# Patient Record
Sex: Female | Born: 1955 | Race: White | Hispanic: No | Marital: Married | Smoking: Current every day smoker
Health system: Southern US, Community
[De-identification: ages and names within clinical notes are randomized; demographics above are authoritative.]

## PROBLEM LIST (undated history)

## (undated) DIAGNOSIS — I10 Essential (primary) hypertension: Secondary | ICD-10-CM

## (undated) DIAGNOSIS — T7840XA Allergy, unspecified, initial encounter: Secondary | ICD-10-CM

## (undated) DIAGNOSIS — B019 Varicella without complication: Secondary | ICD-10-CM

## (undated) DIAGNOSIS — E7800 Pure hypercholesterolemia, unspecified: Secondary | ICD-10-CM

## (undated) DIAGNOSIS — F17200 Nicotine dependence, unspecified, uncomplicated: Secondary | ICD-10-CM

## (undated) HISTORY — DX: Varicella without complication: B01.9

## (undated) HISTORY — DX: Pure hypercholesterolemia, unspecified: E78.00

## (undated) HISTORY — DX: Nicotine dependence, unspecified, uncomplicated: F17.200

## (undated) HISTORY — DX: Essential (primary) hypertension: I10

## (undated) HISTORY — DX: Allergy, unspecified, initial encounter: T78.40XA

---
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
## 2002-09-28 HISTORY — PX: TOTAL ABDOMINAL HYSTERECTOMY W/ BILATERAL SALPINGOOPHORECTOMY: SHX83

## 2010-09-28 HISTORY — PX: COLONOSCOPY: SHX174

## 2013-01-11 ENCOUNTER — Other Ambulatory Visit: Payer: Self-pay | Admitting: Family Medicine

## 2013-01-11 DIAGNOSIS — Z1231 Encounter for screening mammogram for malignant neoplasm of breast: Secondary | ICD-10-CM

## 2013-02-10 ENCOUNTER — Ambulatory Visit: Payer: Self-pay

## 2013-02-16 ENCOUNTER — Ambulatory Visit: Payer: Self-pay

## 2013-02-16 ENCOUNTER — Ambulatory Visit
Admission: RE | Admit: 2013-02-16 | Discharge: 2013-02-16 | Disposition: A | Discharge status: Home / Self Care | Payer: BC Managed Care – PPO | Source: Ambulatory Visit | Attending: Family Medicine | Admitting: Family Medicine

## 2013-02-16 DIAGNOSIS — Z1231 Encounter for screening mammogram for malignant neoplasm of breast: Secondary | ICD-10-CM

## 2013-02-16 LAB — HM MAMMOGRAPHY

## 2014-03-28 ENCOUNTER — Other Ambulatory Visit: Payer: Self-pay

## 2014-03-28 DIAGNOSIS — Z1231 Encounter for screening mammogram for malignant neoplasm of breast: Secondary | ICD-10-CM

## 2014-04-09 ENCOUNTER — Ambulatory Visit
Admission: RE | Admit: 2014-04-09 | Discharge: 2014-04-09 | Disposition: A | Discharge status: Home / Self Care | Payer: BC Managed Care – PPO | Source: Ambulatory Visit

## 2014-04-09 DIAGNOSIS — Z1231 Encounter for screening mammogram for malignant neoplasm of breast: Secondary | ICD-10-CM

## 2016-04-21 DIAGNOSIS — F17210 Nicotine dependence, cigarettes, uncomplicated: Secondary | ICD-10-CM | POA: Diagnosis not present

## 2016-04-21 DIAGNOSIS — I10 Essential (primary) hypertension: Secondary | ICD-10-CM | POA: Diagnosis not present

## 2016-04-21 DIAGNOSIS — E785 Hyperlipidemia, unspecified: Secondary | ICD-10-CM | POA: Diagnosis not present

## 2016-04-21 DIAGNOSIS — Z0000 Encounter for general adult medical examination without abnormal findings: Secondary | ICD-10-CM | POA: Diagnosis not present

## 2016-04-21 LAB — LIPID PANEL
Cholesterol: 163 (ref 0–200)
HDL: 53 (ref 35–70)
LDL CALC: 91
Triglycerides: 94 (ref 40–160)

## 2016-04-21 LAB — BASIC METABOLIC PANEL
BUN: 7 (ref 4–21)
Creatinine: 0.7 (ref 0.5–1.1)
GLUCOSE: 100
Potassium: 4.1 (ref 3.4–5.3)
SODIUM: 140 (ref 137–147)

## 2016-04-21 LAB — HEPATIC FUNCTION PANEL
ALT: 16 (ref 7–35)
AST: 16 (ref 13–35)
Alkaline Phosphatase: 74 (ref 25–125)
Bilirubin, Direct: 0.7 — AB (ref 0.01–0.4)

## 2016-04-21 LAB — CBC AND DIFFERENTIAL
HCT: 45 (ref 36–46)
HEMOGLOBIN: 15.1 (ref 12.0–16.0)
NEUTROS ABS: 79
PLATELETS: 239 (ref 150–399)
WBC: 14.3

## 2016-05-21 ENCOUNTER — Telehealth: Payer: Self-pay | Admitting: Acute Care

## 2016-05-21 NOTE — Telephone Encounter (Signed)
Referral was canceled on 03/12/16 for the lung cancer screening program due to lack of communication with the pt. I sent notification to Dr. Elvis CoilJordan's office through fax. Form was sent to medical records to be scanned. Received form on 05/15/16 stating that form could not be scanned in chart. Nothing further needed.

## 2017-02-02 DIAGNOSIS — Z01419 Encounter for gynecological examination (general) (routine) without abnormal findings: Secondary | ICD-10-CM | POA: Diagnosis not present

## 2017-03-01 DIAGNOSIS — J029 Acute pharyngitis, unspecified: Secondary | ICD-10-CM | POA: Diagnosis not present

## 2017-03-09 DIAGNOSIS — H6502 Acute serous otitis media, left ear: Secondary | ICD-10-CM | POA: Diagnosis not present

## 2017-03-15 ENCOUNTER — Other Ambulatory Visit: Payer: Self-pay | Admitting: Family Medicine

## 2017-03-15 ENCOUNTER — Other Ambulatory Visit: Payer: Self-pay | Admitting: Nurse Practitioner

## 2017-03-15 DIAGNOSIS — Z1231 Encounter for screening mammogram for malignant neoplasm of breast: Secondary | ICD-10-CM

## 2017-03-23 ENCOUNTER — Encounter: Payer: Self-pay | Admitting: Radiology

## 2017-03-23 ENCOUNTER — Ambulatory Visit
Admission: RE | Admit: 2017-03-23 | Discharge: 2017-03-23 | Disposition: A | Discharge status: Home / Self Care | Payer: BLUE CROSS/BLUE SHIELD | Source: Ambulatory Visit | Attending: Family Medicine | Admitting: Family Medicine

## 2017-03-23 DIAGNOSIS — Z1231 Encounter for screening mammogram for malignant neoplasm of breast: Secondary | ICD-10-CM | POA: Diagnosis not present

## 2017-03-30 ENCOUNTER — Encounter: Payer: Self-pay | Admitting: Family Medicine

## 2017-03-30 ENCOUNTER — Ambulatory Visit (INDEPENDENT_AMBULATORY_CARE_PROVIDER_SITE_OTHER): Payer: BLUE CROSS/BLUE SHIELD | Admitting: Family Medicine

## 2017-03-30 VITALS — BP 122/74 | HR 74 | Temp 98.0°F | Resp 20 | Ht 66.0 in | Wt 157.0 lb

## 2017-03-30 DIAGNOSIS — Z716 Tobacco abuse counseling: Secondary | ICD-10-CM | POA: Diagnosis not present

## 2017-03-30 DIAGNOSIS — I10 Essential (primary) hypertension: Secondary | ICD-10-CM | POA: Diagnosis not present

## 2017-03-30 DIAGNOSIS — Z114 Encounter for screening for human immunodeficiency virus [HIV]: Secondary | ICD-10-CM

## 2017-03-30 DIAGNOSIS — Z720 Tobacco use: Secondary | ICD-10-CM

## 2017-03-30 DIAGNOSIS — F17200 Nicotine dependence, unspecified, uncomplicated: Secondary | ICD-10-CM | POA: Insufficient documentation

## 2017-03-30 DIAGNOSIS — E7800 Pure hypercholesterolemia, unspecified: Secondary | ICD-10-CM

## 2017-03-30 DIAGNOSIS — Z1159 Encounter for screening for other viral diseases: Secondary | ICD-10-CM

## 2017-03-30 DIAGNOSIS — Z7689 Persons encountering health services in other specified circumstances: Secondary | ICD-10-CM

## 2017-03-30 MED ORDER — ATORVASTATIN CALCIUM 10 MG PO TABS: 10.0000 mg | ORAL_TABLET | Freq: Every day | ORAL | 1 refills | Status: DC

## 2017-03-30 MED ORDER — LISINOPRIL-HYDROCHLOROTHIAZIDE 20-12.5 MG PO TABS: 1.0000 | ORAL_TABLET | Freq: Every day | ORAL | 1 refills | Status: DC

## 2017-03-30 NOTE — Progress Notes (Signed)
Patient ID: Julia CoinAlice Helmstetter, female  DOB: Oct 29, 1955, 61 y.o.   MRN: [id number] Patient Care Team    Relationship Specialty Notifications Start End  Natalia LeatherwoodKuneff, Renee A, DO PCP - General Family Medicine  03/30/17   Aline Augusthongteum, Auma N, NP Nurse Practitioner Gynecology  03/30/17     Chief Complaint  Patient presents with  . Establish Care    Subjective:  Julia Erickson is a 61 y.o.  female present for new patient establishment. All past medical history, surgical history, allergies, family history, immunizations, medications and social history were obtained and entered in the electronic medical record today. All recent labs, ED visits and hospitalizations within the last year were reviewed.  Hypertension/hyperlipidemia: Pt reports compliance with Lisinopril/HCTZ 20-12.5 mg daily and atorvastatin 10 mg daily. Blood pressures ranges at home within normal limits. Patient denies chest pain, shortness of breath or lower extremity edema. Pt does not take a daily baby ASA. Pt is  prescribed statin. BMP: 04/21/2016 creatinine 0.67 CBC: Unknown, records requested Diet: Low-sodium Exercise: Exercise RF: Hypertension, elevated LDL, family history of heart disease in her parents  Everyday smoker: basic ultra lights, has smoked intermittently over the last 30 years. She has been prescribed Chantix twice within the last 2 years, and has never started it. She wants to quit, but she is not quite ready to start.  Health maintenance:  [place]: [place], [place]. Patient reports normal, 10 year follow-up. Mammogram: completed: 02/2017; birads 1  completed at gynecology Cervical cancer screening: last pap: 2018, results: Normal per patient, completed by: Gynecologist Immunizations: tdap 2016, Influenza  (encouraged yearly), should be offered  Pneumovax 23 with smoking history on CPE, Shingrix will be offered on CPE Infectious disease screening: HIV and Hep C will be collected on CPE, patient is agreeable to  testing today. DEXA: Patient reports DEXA scan approximately 5 years ago, and was normal. She has been estrogen deficient for some time, with a hysterectomy/oophorectomy.   Depression screen PHQ 2/9 03/30/2017  Decreased Interest 0  Down, Depressed, Hopeless 0  PHQ - 2 Score 0   No flowsheet data found.  Current Exercise Habits: The patient does not participate in regular exercise at present Exercise limited by: None identified Fall Risk  03/30/2017  Falls in the past year? No    There is no immunization history on file for this patient.  No exam data present  Past Medical History:  Diagnosis Date  . Allergy   . Chickenpox   . Elevated LDL cholesterol level   . Hypertension   . Smoker    Started about age 61, has  smoked int. over the years.    Allergies  Allergen Reactions  . Sulfa Antibiotics Hives  . Other Nausea And Vomiting    Pine nuts   Past Surgical History:  Procedure Laterality Date  . TONSILLECTOMY AND ADENOIDECTOMY  1969  . TOTAL ABDOMINAL HYSTERECTOMY W/ BILATERAL SALPINGOOPHORECTOMY  2004   Fibroid; heavy bleeding.    Family History  Problem Relation Age of Onset  . Diabetes Mother   . Heart disease Mother   . Diabetes Father   . Heart disease Father   . Early death Father   . Diabetes Brother   . Stroke Maternal Grandmother    Social History   Social History  . Marital status: Married    Spouse name: Rocky LinkKen  . Number of children: 4  . Years of education: 6616   Occupational History  . retired  Social History Main Topics  . Smoking status: Current Every Day Smoker    Packs/day: 1.00    Years: 30.00  . Smokeless tobacco: Never Used  . Alcohol use 0.6 oz/week    1 Glasses of wine per week  . Drug use: No  . Sexual activity: Yes    Partners: Male   Other Topics Concern  . Not on file   Social History Narrative   Married to [place]. They have 4 children.   BS. Retired Armed forces logistics/support/administrative officer.   Drinks caffeine.   Wears her seatbelt. Wears a  bicycle helmet. Smoke detector in the home.   Feels safe in her relationships.      Allergies as of 03/30/2017      Reactions   Sulfa Antibiotics Hives   Other Nausea And Vomiting   Pine nuts      Medication List       Accurate as of 03/30/17 11:07 AM. Always use your most recent med list.          atorvastatin 10 MG tablet Commonly known as:  LIPITOR Take 1 tablet (10 mg total) by mouth daily.   lisinopril-hydrochlorothiazide 20-12.5 MG tablet Commonly known as:  PRINZIDE,ZESTORETIC Take 1 tablet by mouth daily.       All past medical history, surgical history, allergies, family history, immunizations andmedications were updated in the EMR today and reviewed under the history and medication portions of their EMR.    No results found for this or any previous visit (from the past 2160 hour(s)).  Mm Screening Breast Tomo Bilateral  Result Date: 03/23/2017 CLINICAL DATA:  Screening. EXAM: 2D DIGITAL SCREENING BILATERAL MAMMOGRAM WITH CAD AND ADJUNCT TOMO COMPARISON:  Previous exam(s). ACR Breast Density Category b: There are scattered areas of fibroglandular density. FINDINGS: There are no findings suspicious for malignancy. Images were processed with CAD. IMPRESSION: No mammographic evidence of malignancy. A result letter of this screening mammogram will be mailed directly to the patient. RECOMMENDATION: Screening mammogram in one year. (Code:SM-B-01Y) BI-RADS CATEGORY  1: Negative. Electronically Signed   By: Britta Mccreedy M.D.   On: 03/23/2017 16:54     ROS: 14 pt review of systems performed and negative (unless mentioned in an HPI)  Objective: BP 122/74 (BP Location: Right Arm, Patient Position: Sitting, Cuff Size: Normal)   Pulse 74   Temp 98 F (36.7 C)   Resp 20   Ht 5\' 6"  (1.676 m)   Wt 157 lb (71.2 kg)   SpO2 97%   BMI 25.34 kg/m  Gen: Afebrile. No acute distress. Nontoxic in appearance, well-developed, well-nourished, pleasant, Caucasian female. HENT: AT. [place].  MMM, no oral lesions. No Cough on exam, no hoarseness on exam. Eyes:Pupils Equal Round Reactive to light, Extraocular movements intact,  Conjunctiva without redness, discharge or icterus. Neck/lymp/endocrine: Supple, no lymphadenopathy CV: RRR no murmur, no edema, +2/4 P posterior tibialis pulses. No carotid bruits. No JVD. Chest: CTAB, no wheeze, rhonchi or crackles.  Abd: Soft. NTND. BS present. Skin: Warm and well-perfused. Skin intact. Neuro/Msk:  Normal gait. PERLA. EOMi. Alert. Oriented x3.   Psych: Normal affect, dress and demeanor. Normal speech. Normal thought content and judgment.  Assessment/plan: Zola Runion is a 61 y.o. female present for establishment of care.  Essential hypertension - stable today. Continue current regimen and refills provided for 6 months - lisinopril-hydrochlorothiazide (PRINZIDE,ZESTORETIC) 20-12.5 MG tablet; Take 1 tablet by mouth daily.  Dispense: 90 tablet; Refill: 1 - low sodium, exercise counseling.  - CBC w/Diff;  Future - COMPLETE METABOLIC PANEL WITH GFR; Future - Hemoglobin A1c; Future - TSH; Future - F/U 6 months on hypertension.   Elevated LDL cholesterol level - FHX present of HD and stroke.  - Continue lipitor, refills provided today.  - Recommend ASA 81 - atorvastatin (LIPITOR) 10 MG tablet; Take 1 tablet (10 mg total) by mouth daily.  Dispense: 90 tablet; Refill: 1 - Lipid panel; Future Smoker/Encounter for smoking cessation counseling Patient was asked about smoking history. They were advised  to quit smoking in a clear, strong and personalized manner. Their willingness to quit smoking was assessed and felt to be in contemplation  phase. They were offered assistance in cessation and options were explained to them today.  Patient decided to maybe try the chantix she has at home. Patient instructed on proper use of Chantix and timing of smoking cessation after starting Chantix. 5 minutes were spent today in counseling.  Pt advised to  follow up if she would like further assistance. Encounter for screening for HIV - pt agreeable to testing future order for CPE.  - HIV antibody (with reflex); Future Need for hepatitis C screening test - pt agreeable to testing future order for CPE.  - Hepatitis C Antibody; Future    Return for CPE. Mid-August, and every 6 months on hypertension.   Note is dictated utilizing voice recognition software. Although note has been proof read prior to signing, occasional typographical errors still can be missed. If any questions arise, please do not hesitate to call for verification.  Electronically signed by: Felix Pacini, DO [facility] [place]

## 2017-03-30 NOTE — Patient Instructions (Addendum)
It was a pleasure to meet you today! Schedule physical for mid- August  I refilled you meds for 6 months--> F/U HTN  Start the chantix! You can do it.    Please help us help you:  We are honored [facility]. Below you will find basic instructions that you may need to access in the future. Please help us help you by reading the instructions, which cover many of the frequent questions we experience.   Prescription refills and request:  -In order to allow more efficient response time, please call your pharmacy for all refills. They will forward the request electronically to us. This allows for the quickest possible response. Request left on a nurse line can take longer to refill, since these are checked as time allows between office patients and other phone calls.  - refill request can take up to 3-5 working days to complete.  - If request is sent electronically and request is appropiate, it is usually completed in 1-2 business days.  - all patients will need to be seen routinely for all chronic medical conditions requiring prescription medications (see follow-up below). If you are overdue for follow up on your condition, you will be asked to make an appointment and we will call in enough medication to cover you until your appointment (up to 30 days).  - all controlled substances will require a face to face visit to request/refill.  - if you desire your prescriptions to go through a new pharmacy, and have an active script at original pharmacy, you will need to call your pharmacy and have scripts transferred to new pharmacy. This is completed between the pharmacy locations and not by your provider.    Results: If any images or labs were ordered, it can take up to 1 week to get results depending on the test ordered and the lab/facility running and resulting the test. - Normal or stable results, which do not need further discussion, may be released to  your mychart immediately with attached note to you. A call may not be generated for normal results. Please make certain to sign up for mychart. If you have questions on how to activate your mychart you can call the front office.  - If your results need further discussion, our office will attempt to contact you via phone, and if unable to reach you after 2 attempts, we will release your abnormal result to your mychart with instructions.  - All results will be automatically released in mychart after 1 week.  - Your provider will provide you with explanation and instruction on all relevant material in your results. Please keep in mind, results and labs may appear confusing or abnormal to the untrained eye, but it does not mean they are actually abnormal for you personally. If you have any questions about your results that are not covered, or you desire more detailed explanation than what was provided, you should make an appointment with your provider to do so.   Our office handles many outgoing and incoming calls daily. If we have not contacted you within 1 week about your results, please check your mychart to see if there is a message first and if not, then contact our office.  In helping with this matter, you help decrease call volume, and therefore allow us to be able to respond to patients needs more efficiently.   Acute office visits (sick visit):  An acute visit is intended for a  new problem and are scheduled in shorter time slots to allow schedule openings for patients with new problems. This is the appropriate visit to discuss a new problem. In order to provide you with excellent quality medical care with proper time for you to explain your problem, have an exam and receive treatment with instructions, these appointments should be limited to one new problem per visit. If you experience a new problem, in which you desire to be addressed, please make an acute office visit, we save openings on the schedule  to accommodate you. Please do not save your new problem for any other type of visit, let us take care of it properly and quickly for you.   Follow up visits:  Depending on your condition(s) your provider will need to see you routinely in order to provide you with quality care and prescribe medication(s). Most chronic conditions (Example: hypertension, Diabetes, depression/anxiety... etc), require visits a couple times a year. Your provider will instruct you on proper follow up for your personal medical conditions and history. Please make certain to make follow up appointments for your condition as instructed. Failing to do so could result in lapse in your medication treatment/refills. If you request a refill, and are overdue to be seen on a condition, we will always provide you with a 30 day script (once) to allow you time to schedule.    Medicare wellness (well visit): - we have a wonderful Nurse Maudie Mercury), that will meet with you and provide you will yearly medicare wellness visits. These visits should occur yearly (can not be scheduled less than 1 calendar year apart) and cover preventive health, immunizations, advance directives and screenings you are entitled to yearly through your medicare benefits. Do not miss out on your entitled benefits, this is when medicare will pay for these benefits to be ordered for you.  These are strongly encouraged by your provider and is the appropriate type of visit to make certain you are up to date with all preventive health benefits. If you have not had your medicare wellness exam in the last 12 months, please make certain to schedule one by calling the office and schedule your medicare wellness with Maudie Mercury as soon as possible.   Yearly physical (well visit):  - Adults are recommended to be seen yearly for physicals. Check with your insurance and date of your last physical, most insurances require one calendar year between physicals. Physicals include all preventive health  topics, screenings, medical exam and labs that are appropriate for gender/age and history. You may have fasting labs needed at this visit. This is a well visit (not a sick visit), new problems should not be covered during this visit (see acute visit).  - Pediatric patients are seen more frequently when they are younger. Your provider will advise you on well child visit timing that is appropriate for your their age. - This is not a medicare wellness visit. Medicare wellness exams do not have an exam portion to the visit. Some medicare companies allow for a physical, some do not allow a yearly physical. If your medicare allows a yearly physical you can schedule the medicare wellness with our nurse Maudie Mercury and have your physical with your provider after, on the same day. Please check with insurance for your full benefits.   Late Policy/No Shows:  - all new patients should arrive 15-30 minutes earlier than appointment to allow [place] time  to  obtain all personal demographics,  insurance information and for you to complete  office paperwork. - All established patients should arrive 10-15 minutes earlier than appointment time to update all information and be checked in .  - In our best efforts to run on time, if you are late for your appointment you will be asked to either reschedule or if able, we will work you back into the schedule. There will be a wait time to work you back in the schedule,  depending on availability.  - If you are unable to make it to your appointment as scheduled, please call 24 hours ahead of time to allow us to fill the time slot with someone else who needs to be seen. If you do not cancel your appointment ahead of time, you may be charged a no show fee.

## 2017-04-08 ENCOUNTER — Encounter: Payer: Self-pay | Admitting: *Deleted

## 2017-04-08 ENCOUNTER — Encounter: Payer: Self-pay | Admitting: Family Medicine

## 2017-04-13 ENCOUNTER — Encounter: Payer: Self-pay | Admitting: Family Medicine

## 2017-05-12 ENCOUNTER — Encounter: Payer: Self-pay | Admitting: Family Medicine

## 2017-05-12 ENCOUNTER — Ambulatory Visit (INDEPENDENT_AMBULATORY_CARE_PROVIDER_SITE_OTHER): Payer: BLUE CROSS/BLUE SHIELD | Admitting: Family Medicine

## 2017-05-12 VITALS — BP 133/77 | HR 78 | Temp 97.8°F | Resp 20 | Ht 66.0 in | Wt 153.8 lb

## 2017-05-12 DIAGNOSIS — I10 Essential (primary) hypertension: Secondary | ICD-10-CM | POA: Diagnosis not present

## 2017-05-12 DIAGNOSIS — F17210 Nicotine dependence, cigarettes, uncomplicated: Secondary | ICD-10-CM | POA: Diagnosis not present

## 2017-05-12 DIAGNOSIS — Z23 Encounter for immunization: Secondary | ICD-10-CM | POA: Diagnosis not present

## 2017-05-12 DIAGNOSIS — Z0000 Encounter for general adult medical examination without abnormal findings: Secondary | ICD-10-CM

## 2017-05-12 DIAGNOSIS — F17200 Nicotine dependence, unspecified, uncomplicated: Secondary | ICD-10-CM

## 2017-05-12 DIAGNOSIS — E7800 Pure hypercholesterolemia, unspecified: Secondary | ICD-10-CM | POA: Diagnosis not present

## 2017-05-12 DIAGNOSIS — Z122 Encounter for screening for malignant neoplasm of respiratory organs: Secondary | ICD-10-CM

## 2017-05-12 LAB — CBC WITH DIFFERENTIAL/PLATELET
BASOS ABS: 0.1 10*3/uL (ref 0.0–0.1)
Basophils Relative: 1 % (ref 0.0–3.0)
EOS ABS: 0.1 10*3/uL (ref 0.0–0.7)
Eosinophils Relative: 0.8 % (ref 0.0–5.0)
HCT: 47.1 % — ABNORMAL HIGH (ref 36.0–46.0)
Hemoglobin: 15.7 g/dL — ABNORMAL HIGH (ref 12.0–15.0)
LYMPHS ABS: 2.1 10*3/uL (ref 0.7–4.0)
Lymphocytes Relative: 20 % (ref 12.0–46.0)
MCHC: 33.3 g/dL (ref 30.0–36.0)
MCV: 99.4 fl (ref 78.0–100.0)
Monocytes Absolute: 0.4 10*3/uL (ref 0.1–1.0)
Monocytes Relative: 4.2 % (ref 3.0–12.0)
NEUTROS ABS: 7.7 10*3/uL (ref 1.4–7.7)
NEUTROS PCT: 74 % (ref 43.0–77.0)
Platelets: 251 10*3/uL (ref 150.0–400.0)
RBC: 4.74 Mil/uL (ref 3.87–5.11)
RDW: 13.9 % (ref 11.5–15.5)
WBC: 10.4 10*3/uL (ref 4.0–10.5)

## 2017-05-12 LAB — LIPID PANEL
CHOLESTEROL: 169 mg/dL (ref 0–200)
HDL: 48.2 mg/dL (ref >39.00)
LDL Cholesterol: 98 mg/dL (ref 0–99)
NonHDL: 121.21
TRIGLYCERIDES: 117 mg/dL (ref 0.0–149.0)
Total CHOL/HDL Ratio: 4
VLDL: 23.4 mg/dL (ref 0.0–40.0)

## 2017-05-12 LAB — TSH: TSH: 1.89 u[IU]/mL (ref 0.35–4.50)

## 2017-05-12 MED ORDER — ZOSTER VAC RECOMB ADJUVANTED 50 MCG/0.5ML IM SUSR: 0.5000 mL | Freq: Once | INTRAMUSCULAR | 1 refills | Status: AC

## 2017-05-12 NOTE — Progress Notes (Signed)
Patient ID: Talmage Coin, female  DOB: 29-Mar-1956, 61 y.o.   MRN: [id number] Patient Care Team    Relationship Specialty Notifications Start End  Natalia Leatherwood, DO PCP - General Family Medicine  03/30/17   Aline August, NP Nurse Practitioner Gynecology  03/30/17     Chief Complaint  Patient presents with  . Annual Exam    Subjective:  Jazilyn Siegenthaler is a 61 y.o.  Female  present for CPE. All past medical history, surgical history, allergies, family history, immunizations, medications and social history were Updated in the electronic medical record today. All recent labs, ED visits and hospitalizations within the last year were reviewed.  Current every day smoker/lung cancer screen: Patient has smoked 1 pack a day for the past 30 years of cigarettes. She is interested in having the low-dose CT scan screening for lung cancer. She reports this has been ordered for her in the past by her prior PCP, but she has never had it completed. She has been counseled on smoking cessation. She does meet criteria with 30-pack-year history. She is a current smoker. She does not have current symptoms.  Health maintenance: Updated 05/12/2017 Colonoscopy: completed 2012, in [place]. Patient reports normal, 10 year follow-up. Mammogram: completed: 02/2017; birads 1  completed at gynecology Cervical cancer screening: last pap: 2018, results: Normal per patient, completed by: Gynecologist Immunizations: tdap 2016, Influenza  (encouraged yearly),  Pneumovax 23 provided today for smoking history. Shingrix offered.  Infectious disease screening: HIV and Hep C and declined.  DEXA: Patient reports DEXA scan approximately 5 years ago, and was normal. She has been estrogen deficient for some time, with a hysterectomy/oophorectomy. Pt will have completed with Mammogram 2019 Assistive device: none Oxygen use: none Patient has a Dental home. Hospitalizations/ED visits: none  Depression screen [facility] 2/9 05/12/2017  03/30/2017  Decreased Interest 0 0  Down, Depressed, Hopeless 0 0  PHQ - 2 Score 0 0   No flowsheet data found.   There is no immunization history on file for this patient.   Past Medical History:  Diagnosis Date  . Allergy   . Chickenpox   . Elevated LDL cholesterol level   . Hypertension   . Smoker    Started about age 66, has  smoked int. over the years.    Allergies  Allergen Reactions  . Sulfa Antibiotics Hives  . Other Nausea And Vomiting    Pine nuts   Past Surgical History:  Procedure Laterality Date  . COLONOSCOPY  2012  . TONSILLECTOMY AND ADENOIDECTOMY  1969  . TOTAL ABDOMINAL HYSTERECTOMY W/ BILATERAL SALPINGOOPHORECTOMY  2004   Fibroid; heavy bleeding.    Family History  Problem Relation Age of Onset  . Diabetes Mother   . Heart disease Mother   . Hypercholesterolemia Mother   . Thyroid disease Mother   . Dementia Mother        mild cognitve impairment  . Diabetes Father   . Heart disease Father   . Early death Father 75  . CVA Father   . Hypertension Sister   . Hyperlipidemia Sister   . Diabetes Brother   . Hypertension Brother   . Hyperlipidemia Brother   . Stroke Maternal Grandmother   . Lung disease Maternal Grandfather        black lung  . Dementia Paternal Grandmother   . Stroke Paternal Grandfather   . Hypertension Sister    Social History   Social History  . Marital status: Married  Spouse name: Rocky Link  . Number of children: 4  . Years of education: 39   Occupational History  . retired    Social History Main Topics  . Smoking status: Current Every Day Smoker    Packs/day: 1.00    Years: 30.00  . Smokeless tobacco: Never Used  . Alcohol use 0.6 oz/week    1 Glasses of wine per week  . Drug use: No  . Sexual activity: Yes    Partners: Male   Other Topics Concern  . Not on file   Social History Narrative   Married to [place]. They have 4 children.   BS. Retired Armed forces logistics/support/administrative officer.   Drinks caffeine.   Wears her seatbelt.  Wears a bicycle helmet. Smoke detector in the home.   Feels safe in her relationships.      Allergies as of 05/12/2017      Reactions   Sulfa Antibiotics Hives   Other Nausea And Vomiting   Pine nuts      Medication List       Accurate as of 05/12/17  8:50 AM. Always use your most recent med list.          atorvastatin 10 MG tablet Commonly known as:  LIPITOR Take 1 tablet (10 mg total) by mouth daily.   lisinopril-hydrochlorothiazide 20-12.5 MG tablet Commonly known as:  PRINZIDE,ZESTORETIC Take 1 tablet by mouth daily.   Zoster Vac Recomb Adjuvanted injection Commonly known as:  SHINGRIX Inject 0.5 mLs into the muscle once. Repeat dose in 2-6 months       All past medical history, surgical history, allergies, family history, immunizations andmedications were updated in the EMR today and reviewed under the history and medication portions of their EMR.     No results found for this or any previous visit (from the past 2160 hour(s)).  Mm Screening Breast Tomo Bilateral  Result Date: 03/23/2017 CLINICAL DATA:  Screening. EXAM: 2D DIGITAL SCREENING BILATERAL MAMMOGRAM WITH CAD AND ADJUNCT TOMO COMPARISON:  Previous exam(s). ACR Breast Density Category b: There are scattered areas of fibroglandular density. FINDINGS: There are no findings suspicious for malignancy. Images were processed with CAD. IMPRESSION: No mammographic evidence of malignancy. A result letter of this screening mammogram will be mailed directly to the patient. RECOMMENDATION: Screening mammogram in one year. (Code:SM-B-01Y) BI-RADS CATEGORY  1: Negative. Electronically Signed   By: Britta Mccreedy M.D.   On: 03/23/2017 16:54    ROS: 14 pt review of systems performed and negative (unless mentioned in an HPI)  Objective: BP 133/77 (BP Location: Left Arm, Patient Position: Sitting, Cuff Size: Normal)   Pulse 78   Temp 97.8 F (36.6 C)   Resp 20   Ht 5\' 6"  (1.676 m)   Wt 153 lb 12 oz (69.7 kg)   SpO2 99%    BMI 24.82 kg/m  Gen: Afebrile. No acute distress. Nontoxic in appearance, well-developed, well-nourished, Pleasant Caucasian female. HENT: AT. [place]. Bilateral TM visualized and normal in appearance, normal external auditory canal. MMM, no oral lesions, adequate dentition. Bilateral nares within normal limits. Throat without erythema, ulcerations or exudates. no Cough on exam, no hoarseness on exam. Eyes:Pupils Equal Round Reactive to light, Extraocular movements intact,  Conjunctiva without redness, discharge or icterus. Neck/lymp/endocrine: Supple,no lymphadenopathy, no thyromegaly CV: RRR no murmur, no edema, +2/4 P posterior tibialis pulses.  No carotid bruits. No JVD. Chest: CTAB, no wheeze, rhonchi or crackles. normal Respiratory effort. good Air movement. Abd: Soft. Flat. NTND. BS present. No Masses  palpated. No hepatosplenomegaly. No rebound tenderness or guarding. Skin: No rashes, purpura or petechiae. Warm and well-perfused. Skin intact. Neuro/Msk:  Normal gait. PERLA. EOMi. Alert. Oriented x3. Cranial nerves II through XII intact. Muscle strength 5/5 upper/lower extremity. DTRs equal bilaterally. Psych: Normal affect, dress and demeanor. Normal speech. Normal thought content and judgment.   No exam data present  Assessment/plan: Talmage Coinlice Schmieg is a 61 y.o. female present for CPE. Encounter for preventive health examination Patient was encouraged to exercise greater than 150 minutes a week. Patient was encouraged to choose a diet filled with fresh fruits and vegetables, and lean meats. AVS provided to patient today for education/recommendation on gender specific health and safety maintenance. Pneumovax 23 provided today for smoking history. Low dose CT lung cancer screening ordered today. Patient was counseled. Shingrix script printed.  [place]on[place]: completed 2012, [place]. Patient reports normal, 10 year follow-up. Mammogram: completed: 02/2017; birads 1  completed at  gynecology Cervical cancer screening: last pap: 2018, results: Normal per patient, completed by: Gynecologist Patient declined HIV and hepatitis C screening. Essential hypertension/HLD - CBC, CMP, TSH, lipid panel collected today. Patient will follow routinely every 6 months on her hypertension. - Stable on lisinopril 20-12.5 Need for 23-polyvalent pneumococcal polysaccharide vaccine - Current every day smoker: Pneumococcal polysaccharide vaccine 23-valent greater than or equal to 2yo subcutaneous/IM Encounter for screening for lung cancer/tobacco use disorder - 30 pack year history of smoking, current smoker. Patient has been counseled on smoking cessation and lung cancer screening. Patient desires to go forward with lung cancer screening. - CT CHEST LUNG CANCER SCREENING LOW DOSE WO CONTRAST; Future   Return in about 1 year (around 05/12/2018) for CPE.  Electronically signed by: Felix Pacinienee Angeleah Labrake, DO [facility]

## 2017-05-12 NOTE — Patient Instructions (Signed)

## 2017-05-13 ENCOUNTER — Telehealth: Payer: Self-pay | Admitting: Family Medicine

## 2017-05-13 LAB — COMPLETE METABOLIC PANEL WITH GFR
ALBUMIN: 4.5 g/dL (ref 3.6–5.1)
ALT: 17 U/L (ref 6–29)
AST: 15 U/L (ref 10–35)
Alkaline Phosphatase: 69 U/L (ref 33–130)
BUN: 9 mg/dL (ref 7–25)
CHLORIDE: 103 mmol/L (ref 98–110)
CO2: 27 mmol/L (ref 20–32)
Calcium: 9.8 mg/dL (ref 8.6–10.4)
Creat: 0.61 mg/dL (ref 0.50–0.99)
GFR, Est African American: >89 mL/min (ref >=60)
GFR, Est Non African American: >89 mL/min (ref >=60)
GLUCOSE: 92 mg/dL (ref 65–99)
POTASSIUM: 4.1 mmol/L (ref 3.5–5.3)
SODIUM: 138 mmol/L (ref 135–146)
Total Bilirubin: 0.5 mg/dL (ref 0.2–1.2)
Total Protein: 6.6 g/dL (ref 6.1–8.1)

## 2017-05-13 LAB — HEMOGLOBIN A1C
Hgb A1c MFr Bld: 5.5 % (ref <5.7)
Mean Plasma Glucose: 111 mg/dL

## 2017-05-13 NOTE — Telephone Encounter (Signed)
Left message with results on patient voice mail per DPR 

## 2017-05-13 NOTE — Telephone Encounter (Signed)
Please call patient, blood work is normal.

## 2017-05-14 ENCOUNTER — Telehealth: Payer: Self-pay | Admitting: Family Medicine

## 2017-05-14 NOTE — Telephone Encounter (Signed)
Nurse in Lung cancer screening clinic got the referral for patient but wants to make sure that they are understanding it correctly.  Does Dr Claiborne Billings just want them to do screening CT or does she want the lung cancer clinic to follow her care.   Also, pt has [employer]BS and that typically does not cover Lung Cancer screening CT  - patient can get screening CT done [facility] $299 if Dr Claiborne Billings does only want her to have CT.   Nurse was made aware that Dr Claiborne Billings and her nurse are off today and will return on Monday.

## 2017-05-17 ENCOUNTER — Telehealth: Payer: Self-pay | Admitting: Acute Care

## 2017-05-17 NOTE — Telephone Encounter (Signed)
Called [facility] and let them know this is just order for Low dose CT to be done at [facility] which has already been scheduled. She does not need to be seen at the Lung clinic at this time.

## 2017-05-17 NOTE — Telephone Encounter (Signed)
I am a confused on the question. I ordered a low dose CT for screening per pt request. I did not refer her to a clinic. Please make pt aware if her insurance does not cover it, and if she chooses she can go to [facility] for the cheaper price, which it appears she has already been scheduled there.

## 2017-05-18 NOTE — Telephone Encounter (Signed)
Will cancel lung cancer screening referral.  Nothing further needed.

## 2017-07-20 ENCOUNTER — Ambulatory Visit
Admission: RE | Admit: 2017-07-20 | Discharge: 2017-07-20 | Disposition: A | Discharge status: Home / Self Care | Payer: BLUE CROSS/BLUE SHIELD | Source: Ambulatory Visit | Attending: Family Medicine | Admitting: Family Medicine

## 2017-07-20 ENCOUNTER — Encounter: Payer: Self-pay | Admitting: Family Medicine

## 2017-07-20 DIAGNOSIS — J439 Emphysema, unspecified: Secondary | ICD-10-CM | POA: Insufficient documentation

## 2017-07-20 DIAGNOSIS — I700 Atherosclerosis of aorta: Secondary | ICD-10-CM | POA: Insufficient documentation

## 2017-07-20 DIAGNOSIS — F17200 Nicotine dependence, unspecified, uncomplicated: Secondary | ICD-10-CM | POA: Diagnosis not present

## 2017-07-20 DIAGNOSIS — R911 Solitary pulmonary nodule: Secondary | ICD-10-CM | POA: Insufficient documentation

## 2017-07-20 DIAGNOSIS — F17210 Nicotine dependence, cigarettes, uncomplicated: Secondary | ICD-10-CM

## 2017-07-20 DIAGNOSIS — Z122 Encounter for screening for malignant neoplasm of respiratory organs: Secondary | ICD-10-CM

## 2017-10-02 ENCOUNTER — Other Ambulatory Visit: Payer: Self-pay | Admitting: Family Medicine

## 2017-10-02 DIAGNOSIS — E7800 Pure hypercholesterolemia, unspecified: Secondary | ICD-10-CM

## 2017-10-02 DIAGNOSIS — I10 Essential (primary) hypertension: Secondary | ICD-10-CM

## 2017-10-04 ENCOUNTER — Telehealth: Payer: Self-pay | Admitting: Family Medicine

## 2017-10-04 ENCOUNTER — Other Ambulatory Visit: Payer: Self-pay | Admitting: Family Medicine

## 2017-10-04 DIAGNOSIS — E7800 Pure hypercholesterolemia, unspecified: Secondary | ICD-10-CM

## 2017-10-04 DIAGNOSIS — I10 Essential (primary) hypertension: Secondary | ICD-10-CM

## 2017-10-04 NOTE — Telephone Encounter (Signed)
Copied from CRM [phone]. Topic: Quick Communication - Rx Refill/Question >> Oct 04, 2017  9:45 AM Anice PaganiniMunoz, Mena Simonis I, NT wrote: Has the patient contacted their pharmacy?   (Agent: If no, request that the patient contact the pharmacy for the refill.)  Preferred Pharmacy (with phone number or street name): ***  Agent: Please be advised that RX refills may take up to 3 business days. We ask that you follow-up with your pharmacy.

## 2017-10-04 NOTE — Telephone Encounter (Signed)
Copied from CRM [phone]. Topic: Inquiry >> Oct 04, 2017  9:48 AM Anice PaganiniMunoz, Adisa Litt I, NT wrote: Reason for CRM: Pt call and said she need refill on Her med today she  is going out Of town and she is out Of Med

## 2017-10-04 NOTE — Telephone Encounter (Signed)
Spoke to patient for clarification.  Patient needs atorvastatin and lisinopril refilled, [facility].  Please advise.

## 2017-10-15 ENCOUNTER — Encounter: Payer: Self-pay | Admitting: Family Medicine

## 2017-10-15 ENCOUNTER — Ambulatory Visit: Payer: BLUE CROSS/BLUE SHIELD | Admitting: Family Medicine

## 2017-10-15 VITALS — BP 130/80 | HR 80 | Temp 97.5°F | Wt 154.8 lb

## 2017-10-15 DIAGNOSIS — J439 Emphysema, unspecified: Secondary | ICD-10-CM

## 2017-10-15 DIAGNOSIS — Z23 Encounter for immunization: Secondary | ICD-10-CM

## 2017-10-15 DIAGNOSIS — I700 Atherosclerosis of aorta: Secondary | ICD-10-CM

## 2017-10-15 DIAGNOSIS — E7800 Pure hypercholesterolemia, unspecified: Secondary | ICD-10-CM | POA: Diagnosis not present

## 2017-10-15 DIAGNOSIS — I10 Essential (primary) hypertension: Secondary | ICD-10-CM | POA: Diagnosis not present

## 2017-10-15 MED ORDER — ATORVASTATIN CALCIUM 10 MG PO TABS: 10.0000 mg | ORAL_TABLET | Freq: Every day | ORAL | 1 refills | Status: DC

## 2017-10-15 MED ORDER — LISINOPRIL-HYDROCHLOROTHIAZIDE 20-12.5 MG PO TABS
1.0000 | ORAL_TABLET | Freq: Every day | ORAL | 1 refills | Status: DC
Start: 2017-10-15 — End: 2018-04-28

## 2017-10-15 NOTE — Progress Notes (Signed)
Patient ID: Julia Erickson, female  DOB: [birth year], 62 y.o.   MRN: [id number] Patient Care Team    Relationship Specialty Notifications Start End  Natalia Leatherwood, DO PCP - General Family Medicine  03/30/17   Aline August, NP Nurse Practitioner Gynecology  03/30/17     Chief Complaint  Patient presents with  . Hypertension    pt is here for meds refill    Subjective:  Julia Erickson is a 62 y.o.  Female  present for HTN follow up  Hypertension/HLD/Aortic atherosclerosis: Pt reports compliance with Lisinopril-Hctz and lipitor. Blood pressures ranges at home within normal limits. Patient denies chest pain, shortness of breath or lower extremity edema. Pt does take a daily baby ASA. Pt is  prescribed statin. BMP: 05/12/2017 within normal limits CBC: 05/12/2017 with mild increase in hemoglobin in a smoker Diet: Low-sodium Exercise: Routine RF: Hypertension, hyperlipidemia, aortic atherosclerosis, smoker, family history of heart disease, family history stroke   Depression screen [facility] 2/9 10/15/2017 05/12/2017 03/30/2017  Decreased Interest 0 0 0  Down, Depressed, Hopeless 0 0 0  PHQ - 2 Score 0 0 0   No flowsheet data found.  Immunization History  Administered Date(s) Administered  . Pneumococcal Polysaccharide-23 05/12/2017     Past Medical History:  Diagnosis Date  . Allergy   . Chickenpox   . Elevated LDL cholesterol level   . Hypertension   . Smoker    Started about age 51, has  smoked int. over the years.    Allergies  Allergen Reactions  . Sulfa Antibiotics Hives  . Other Nausea And Vomiting    Pine nuts   Past Surgical History:  Procedure Laterality Date  . COLONOSCOPY  2012  . TONSILLECTOMY AND ADENOIDECTOMY  1969  . TOTAL ABDOMINAL HYSTERECTOMY W/ BILATERAL SALPINGOOPHORECTOMY  2004   Fibroid; heavy bleeding.    Family History  Problem Relation Age of Onset  . Diabetes Mother   . Heart disease Mother   . Hypercholesterolemia Mother   . Thyroid disease  Mother   . Dementia Mother        mild cognitve impairment  . Diabetes Father   . Heart disease Father   . Early death Father 64  . CVA Father   . Hypertension Sister   . Hyperlipidemia Sister   . Diabetes Brother   . Hypertension Brother   . Hyperlipidemia Brother   . Stroke Maternal Grandmother   . Lung disease Maternal Grandfather        black lung  . Dementia Paternal Grandmother   . Stroke Paternal Grandfather   . Hypertension Sister    Social History   Socioeconomic History  . Marital status: Married    Spouse name: Rocky Link  . Number of children: 4  . Years of education: 86  . Highest education level: Not on file  Social Needs  . Financial resource strain: Not on file  . Food insecurity - worry: Not on file  . Food insecurity - inability: Not on file  . Transportation needs - medical: Not on file  . Transportation needs - non-medical: Not on file  Occupational History  . Occupation: retired  Tobacco Use  . Smoking status: Current Every Day Smoker    Packs/day: 1.00    Years: 30.00    Pack years: 30.00  . Smokeless tobacco: Never Used  Substance and Sexual Activity  . Alcohol use: Yes    Alcohol/week: 0.6 oz    Types: 1 Glasses  of wine per week  . Drug use: No  . Sexual activity: Yes    Partners: Male  Other Topics Concern  . Not on file  Social History Narrative   Married to [place]. They have 4 children.   BS. 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   Drinks caffeine.   Wears her seatbelt. Wears a bicycle helmet. Smoke detector in the home.   Feels safe in her relationships.   Allergies as of 10/15/2017      Reactions   Sulfa Antibiotics Hives   Other Nausea And Vomiting   Pine nuts      Medication List        Accurate as of 10/15/17 10:27 AM. Always use your most recent med list.          aspirin EC 81 MG tablet Take 81 mg by mouth daily.   atorvastatin 10 MG tablet Commonly known as:  LIPITOR Take 1 tablet (10 mg total) by mouth daily.     lisinopril-hydrochlorothiazide 20-12.5 MG tablet Commonly known as:  PRINZIDE,ZESTORETIC Take 1 tablet by mouth daily.       All past medical history, surgical history, allergies, family history, immunizations andmedications were updated in the EMR today and reviewed under the history and medication portions of their EMR.     No results found for this or any previous visit (from the past 2160 hour(s)).  ROS: 14 pt review of systems performed and negative (unless mentioned in an HPI)  Objective: BP 130/80 (BP Location: Right Arm, Patient Position: Sitting, Cuff Size: Large)   Pulse 80   Temp (!) 97.5 F (36.4 C) (Oral)   Wt 154 lb 12.8 oz (70.2 kg)   SpO2 100%   BMI 24.99 kg/m  Gen: Afebrile. No acute distress. Nontoxic in appearance, well-developed, well-nourished, Caucasian female. Very pleasant. HENT: AT. [place]. MMM.  Eyes:Pupils Equal Round Reactive to light, Extraocular movements intact,  Conjunctiva without redness, discharge or icterus. CV: RRR no murmur, no edema, +2/4 P posterior tibialis pulses Chest: CTAB, no wheeze or crackles Abd: Soft. NTND. BS present. Neuro:  Normal gait. PERLA. EOMi. Alert. Oriented x3  Psych: Normal affect, dress and demeanor. Normal speech. Normal thought content and judgment.  No exam data present  Assessment/plan: Julia Coinlice Beegle is a 62 y.o. female present for CPE. Essential hypertension/HLD/Aortic atherosclerosis (HCC) - Stable today. Refills on Lipitor and lisinopril-HCTZ.  - Continue daily baby ASA - lisinopril-hydrochlorothiazide (PRINZIDE,ZESTORETIC) 20-12.5 MG tablet; Take 1 tablet by mouth daily.  Dispense: 90 tablet; Refill: 1 - atorvastatin (LIPITOR) 10 MG tablet; Take 1 tablet (10 mg total) by mouth daily.  Dispense: 90 tablet; Refill: 1 - F/U 6 mos.   Pulmonary emphysema, unspecified emphysema type (HCC) - yearly LDCT for pul nodule, smoking history and emphysema.  - she is slowly tapering back her cigarette use and plans to quit.    Flu shot administered today.  Every 6 months (CPE/HTN)    Electronically signed by: Felix Pacinienee Raj Landress, DO [facility]

## 2017-10-15 NOTE — Patient Instructions (Addendum)
It was a pleasure to see you today.  I have refilled your medications for you.  Follow up every 6 months .    Please help us help you:  We are honored [facility]. Below you will find basic instructions that you may need to access in the future. Please help us help you by reading the instructions, which cover many of the frequent questions we experience.   Prescription refills and request:  -In order to allow more efficient response time, please call your pharmacy for all refills. They will forward the request electronically to us. This allows for the quickest possible response. Request left on a nurse line can take longer to refill, since these are checked as time allows between office patients and other phone calls.  - refill request can take up to 3-5 working days to complete.  - If request is sent electronically and request is appropiate, it is usually completed in 1-2 business days.  - all patients will need to be seen routinely for all chronic medical conditions requiring prescription medications (see follow-up below). If you are overdue for follow up on your condition, you will be asked to make an appointment and we will call in enough medication to cover you until your appointment (up to 30 days).  - all controlled substances will require a face to face visit to request/refill.  - if you desire your prescriptions to go through a new pharmacy, and have an active script at original pharmacy, you will need to call your pharmacy and have scripts transferred to new pharmacy. This is completed between the pharmacy locations and not by your provider.    Results: If any images or labs were ordered, it can take up to 1 week to get results depending on the test ordered and the lab/facility running and resulting the test. - Normal or stable results, which do not need further discussion, may be released to your mychart immediately with attached note to  you. A call may not be generated for normal results. Please make certain to sign up for mychart. If you have questions on how to activate your mychart you can call the front office.  - If your results need further discussion, our office will attempt to contact you via phone, and if unable to reach you after 2 attempts, we will release your abnormal result to your mychart with instructions.  - All results will be automatically released in mychart after 1 week.  - Your provider will provide you with explanation and instruction on all relevant material in your results. Please keep in mind, results and labs may appear confusing or abnormal to the untrained eye, but it does not mean they are actually abnormal for you personally. If you have any questions about your results that are not covered, or you desire more detailed explanation than what was provided, you should make an appointment with your provider to do so.   Our office handles many outgoing and incoming calls daily. If we have not contacted you within 1 week about your results, please check your mychart to see if there is a message first and if not, then contact our office.  In helping with this matter, you help decrease call volume, and therefore allow us to be able to respond to patients needs more efficiently.   Acute office visits (sick visit):  An acute visit is intended for a new problem and are scheduled in shorter time  slots to allow schedule openings for patients with new problems. This is the appropriate visit to discuss a new problem. In order to provide you with excellent quality medical care with proper time for you to explain your problem, have an exam and receive treatment with instructions, these appointments should be limited to one new problem per visit. If you experience a new problem, in which you desire to be addressed, please make an acute office visit, we save openings on the schedule to accommodate you. Please do not save your  new problem for any other type of visit, let us take care of it properly and quickly for you.   Follow up visits:  Depending on your condition(s) your provider will need to see you routinely in order to provide you with quality care and prescribe medication(s). Most chronic conditions (Example: hypertension, Diabetes, depression/anxiety... etc), require visits a couple times a year. Your provider will instruct you on proper follow up for your personal medical conditions and history. Please make certain to make follow up appointments for your condition as instructed. Failing to do so could result in lapse in your medication treatment/refills. If you request a refill, and are overdue to be seen on a condition, we will always provide you with a 30 day script (once) to allow you time to schedule.    Medicare wellness (well visit): - we have a wonderful Nurse Maudie Mercury), that will meet with you and provide you will yearly medicare wellness visits. These visits should occur yearly (can not be scheduled less than 1 calendar year apart) and cover preventive health, immunizations, advance directives and screenings you are entitled to yearly through your medicare benefits. Do not miss out on your entitled benefits, this is when medicare will pay for these benefits to be ordered for you.  These are strongly encouraged by your provider and is the appropriate type of visit to make certain you are up to date with all preventive health benefits. If you have not had your medicare wellness exam in the last 12 months, please make certain to schedule one by calling the office and schedule your medicare wellness with Maudie Mercury as soon as possible.   Yearly physical (well visit):  - Adults are recommended to be seen yearly for physicals. Check with your insurance and date of your last physical, most insurances require one calendar year between physicals. Physicals include all preventive health topics, screenings, medical exam and labs  that are appropriate for gender/age and history. You may have fasting labs needed at this visit. This is a well visit (not a sick visit), new problems should not be covered during this visit (see acute visit).  - Pediatric patients are seen more frequently when they are younger. Your provider will advise you on well child visit timing that is appropriate for your their age. - This is not a medicare wellness visit. Medicare wellness exams do not have an exam portion to the visit. Some medicare companies allow for a physical, some do not allow a yearly physical. If your medicare allows a yearly physical you can schedule the medicare wellness with our nurse Maudie Mercury and have your physical with your provider after, on the same day. Please check with insurance for your full benefits.   Late Policy/No Shows:  - all new patients should arrive 15-30 minutes earlier than appointment to allow [place] time  to  obtain all personal demographics,  insurance information and for you to complete office paperwork. - All established patients should arrive  10-15 minutes earlier than appointment time to update all information and be checked in .  - In our best efforts to run on time, if you are late for your appointment you will be asked to either reschedule or if able, we will work you back into the schedule. There will be a wait time to work you back in the schedule,  depending on availability.  - If you are unable to make it to your appointment as scheduled, please call 24 hours ahead of time to allow [place] to fill the time slot with someone else who needs to be seen. If you do not cancel your appointment ahead of time, you may be charged a no show fee.

## 2018-03-22 ENCOUNTER — Ambulatory Visit: Payer: BLUE CROSS/BLUE SHIELD | Admitting: Family Medicine

## 2018-03-22 ENCOUNTER — Encounter: Payer: Self-pay | Admitting: Family Medicine

## 2018-03-22 VITALS — BP 107/67 | HR 90 | Temp 98.4°F | Resp 20 | Ht 66.0 in | Wt 154.0 lb

## 2018-03-22 DIAGNOSIS — N611 Abscess of the breast and nipple: Secondary | ICD-10-CM

## 2018-03-22 MED ORDER — DOXYCYCLINE HYCLATE 100 MG PO TABS: 100.0000 mg | ORAL_TABLET | Freq: Two times a day (BID) | ORAL | 0 refills | Status: DC

## 2018-03-22 NOTE — Progress Notes (Signed)
Julia Erickson , 14-May-1956, 62 y.o., female MRN: [id number] Patient Care Team    Relationship Specialty Notifications Start End  Natalia LeatherwoodKuneff, Charrise Lardner A, DO PCP - General Family Medicine  03/30/17   Aline Augusthongteum, Auma N, NP Nurse Practitioner Gynecology  03/30/17     Chief Complaint  Patient presents with  . Cyst    right breast x 4 days     Subjective: Pt presents for an OV with complaints of right breast cyst of 4 days duration.  Associated symptoms include pus-like drainage and tenderness. She denies fever, chills, nausea or vomit. She has not had abscess of the skin in the past. She has been trying to keep area clean and applying bacitracin ointment to it daily. This morning it started to drain.  Pt is allergic to sulfa  [facility] 03/22/2018 10/15/2017 05/12/2017 03/30/2017  Decreased Interest 0 0 0 0  Down, Depressed, Hopeless 0 0 0 0  PHQ - 2 Score 0 0 0 0    Allergies  Allergen Reactions  . Sulfa Antibiotics Hives  . Other Nausea And Vomiting    Pine nuts   Social History   Tobacco Use  . Smoking status: Current Every Day Smoker    Packs/day: 1.00    Years: 30.00    Pack years: 30.00  . Smokeless tobacco: Never Used  Substance Use Topics  . Alcohol use: Yes    Alcohol/week: 0.6 oz    Types: 1 Glasses of wine per week   Past Medical History:  Diagnosis Date  . Allergy   . Chickenpox   . Elevated LDL cholesterol level   . Hypertension   . Smoker    Started about age 62, has  smoked int. over the years.    Past Surgical History:  Procedure Laterality Date  . COLONOSCOPY  2012  . TONSILLECTOMY AND ADENOIDECTOMY  1969  . TOTAL ABDOMINAL HYSTERECTOMY W/ BILATERAL SALPINGOOPHORECTOMY  2004   Fibroid; heavy bleeding.    Family History  Problem Relation Age of Onset  . Diabetes Mother   . Heart disease Mother   . Hypercholesterolemia Mother   . Thyroid disease Mother   . Dementia Mother        mild cognitve impairment  . Diabetes Father   . Heart disease  Father   . Early death Father [id number]  . CVA Father   . Hypertension Sister   . Hyperlipidemia Sister   . Diabetes Brother   . Hypertension Brother   . Hyperlipidemia Brother   . Stroke Maternal Grandmother   . Lung disease Maternal Grandfather        black lung  . Dementia Paternal Grandmother   . Stroke Paternal Grandfather   . Hypertension Sister    Allergies as of 03/22/2018      Reactions   Sulfa Antibiotics Hives   Other Nausea And Vomiting   Pine nuts      Medication List        Accurate as of 03/22/18  9:52 AM. Always use your most recent med list.          aspirin EC 81 MG tablet Take 81 mg by mouth daily.   atorvastatin 10 MG tablet Commonly known as:  LIPITOR Take 1 tablet (10 mg total) by mouth daily.   lisinopril-hydrochlorothiazide 20-12.5 MG tablet Commonly known as:  PRINZIDE,ZESTORETIC Take 1 tablet by mouth daily.       All past medical history, surgical history, allergies, family history, immunizations  andmedications were updated in the EMR today and reviewed under the history and medication portions of their EMR.     ROS: Negative, with the exception of above mentioned in HPI   Objective:  BP 107/67 (BP Location: Right Arm, Patient Position: Sitting, Cuff Size: Normal)   Pulse 90   Temp 98.4 F (36.9 C)   Resp 20   Ht 5\' 6"  (1.676 m)   Wt 154 lb (69.9 kg)   SpO2 98%   BMI 24.86 kg/m  Body mass index is 24.86 kg/m. Gen: Afebrile. No acute distress. Nontoxic in appearance, well developed, well nourished.  HENT: AT. [place].  MMM Eyes:Pupils Equal Round Reactive to light, Extraocular movements intact,  Conjunctiva without redness, discharge or icterus. Skin: ~2.5 cm raised red abscess with central opening and thick green drainage. no rashes, purpura or petechiae. Area was cleaned and gently pressure applied to express all drainage through central opening. Pt tolerated well.   Neuro: Normal gait. PERLA. EOMi. Alert. Oriented x3  Psych: Normal  affect, dress and demeanor. Normal speech. Normal thought content and judgment.  No exam data present No results found. No results found for this or any previous visit (from the past 24 hour(s)).  Assessment/Plan: Aoi Kouns is a 62 y.o. female present for OV for  Abscess of right breast - Pus- like drainage expressed and cultured. Pt tolerated treatment ans was able to express all pus-like drainage.  - Wound culture - NSAIDS for discomfort.  - Pt provided instruction to keep area clean and dry. Change dressing daily and use abx ointment.  - Doxycyline BID for 7 days prescribed.  - F/U 1 week sooner if not improving. May need excision at a later date if continues to become infected.  Reviewed expectations re: course of current medical issues.  Discussed self-management of symptoms.  Outlined signs and symptoms indicating need for more acute intervention.  Patient verbalized understanding and all questions were answered.  Patient received an After-Visit Summary.    No orders of the defined types were placed in this encounter.    Note is dictated utilizing voice recognition software. Although note has been proof read prior to signing, occasional typographical errors still can be missed. If any questions arise, please do not hesitate to call for verification.   electronically signed by:  Felix Pacini, DO  [facility] [place]

## 2018-03-22 NOTE — Patient Instructions (Signed)
Keep wound clean and dry. Apply clean dressing daily and keep covered.  Followup in 1 week (or as close as possible if on vacation).   Skin Abscess A skin abscess is an infected area on or under your skin that contains pus and other material. An abscess can happen almost anywhere on your body. Some abscesses break open (rupture) on their own. Most continue to get worse unless they are treated. The infection can spread deeper into the body and into your blood, which can make you feel sick. Treatment usually involves draining the abscess. Follow these instructions at home: Abscess Care  If you have an abscess that has not drained, place a warm, clean, wet washcloth over the abscess several times a day. Do this as told by your doctor.  Follow instructions from your doctor about how to take care of your abscess. Make sure you: ? Cover the abscess with a bandage (dressing). ? Change your bandage or gauze as told by your doctor. ? Wash your hands with soap and water before you change the bandage or gauze. If you cannot use soap and water, use hand sanitizer.  Check your abscess every day for signs that the infection is getting worse. Check for: ? More redness, swelling, or pain. ? More fluid or blood. ? Warmth. ? More pus or a bad smell. Medicines   Take over-the-counter and prescription medicines only as told by your doctor.  If you were prescribed an antibiotic medicine, take it as told by your doctor. Do not stop taking the antibiotic even if you start to feel better. General instructions  To avoid spreading the infection: ? Do not share personal care items, towels, or hot tubs with others. ? Avoid making skin-to-skin contact with other people.  Keep all follow-up visits as told by your doctor. This is important. Contact a doctor if:  You have more redness, swelling, or pain around your abscess.  You have more fluid or blood coming from your abscess.  Your abscess feels warm when  you touch it.  You have more pus or a bad smell coming from your abscess.  You have a fever.  Your muscles ache.  You have chills.  You feel sick. Get help right away if:  You have very bad (severe) pain.  You see red streaks on your skin spreading away from the abscess. This information is not intended to replace advice given to you by your health care provider. Make sure you discuss any questions you have with your health care provider. Document Released: 03/02/2008 Document Revised: 05/10/2016 Document Reviewed: 07/24/2015 Elsevier Interactive Patient Education  [employer]2018 [employer]

## 2018-03-25 ENCOUNTER — Telehealth: Payer: Self-pay | Admitting: Family Medicine

## 2018-03-25 LAB — WOUND CULTURE
MICRO NUMBER:: 90759139
SPECIMEN QUALITY: ADEQUATE

## 2018-03-25 NOTE — Telephone Encounter (Signed)
Patient advised and voiced understanding.  She states it is doing much better.

## 2018-03-25 NOTE — Telephone Encounter (Signed)
Please inform patient the following information: Her wound culture is positive for normal flora abundance. The abx prescribed should work well. Continue wound care  Daily until healed.

## 2018-04-04 ENCOUNTER — Ambulatory Visit: Payer: BLUE CROSS/BLUE SHIELD | Admitting: Family Medicine

## 2018-04-08 ENCOUNTER — Ambulatory Visit: Payer: BLUE CROSS/BLUE SHIELD | Admitting: Family Medicine

## 2018-04-08 ENCOUNTER — Encounter: Payer: Self-pay | Admitting: Family Medicine

## 2018-04-08 VITALS — BP 119/78 | HR 84 | Resp 16 | Ht 66.0 in | Wt 152.0 lb

## 2018-04-08 DIAGNOSIS — L720 Epidermal cyst: Secondary | ICD-10-CM | POA: Diagnosis not present

## 2018-04-08 NOTE — Progress Notes (Signed)
Julia Erickson , Julia Erickson, 62 y.o., Julia Erickson MRN: [id number] Patient Care Team    Relationship Specialty Notifications Start End  Natalia Leatherwood, DO PCP - General Family Medicine  03/30/17   Aline August, NP Nurse Practitioner Gynecology  03/30/17     Chief Complaint  Patient presents with  . Follow-up    right breast abcess     Subjective:  Julia Erickson is a 62 y.o. Julia Erickson present for follow up on treatment on abscess/cyst right breast in bra line. She reports it has much improved with abx. She is still trying to put a dressing over area to keep clean and help relieve pressure from her bra, which is uncomfortable. No redness or additional drainage.  Prior note:  Pt presents for an OV with complaints of right breast cyst of 4 days duration.  Associated symptoms include pus-like drainage and tenderness. She denies fever, chills, nausea or vomit. She has not had abscess of the skin in the past. She has been trying to keep area clean and applying bacitracin ointment to it daily. This morning it started to drain.  Pt is allergic to sulfa  Depression screen [facility] 2/9 03/22/2018 10/15/2017 05/12/2017 03/30/2017  Decreased Interest 0 0 0 0  Down, Depressed, Hopeless 0 0 0 0  PHQ - 2 Score 0 0 0 0    Allergies  Allergen Reactions  . Sulfa Antibiotics Hives  . Other Nausea And Vomiting    Pine nuts   Social History   Tobacco Use  . Smoking status: Current Every Day Smoker    Packs/day: 1.00    Years: 30.00    Pack years: 30.00  . Smokeless tobacco: Never Used  Substance Use Topics  . Alcohol use: Yes    Alcohol/week: 0.6 oz    Types: 1 Glasses of wine per week   Past Medical History:  Diagnosis Date  . Allergy   . Chickenpox   . Elevated LDL cholesterol level   . Hypertension   . Smoker    Started about age 48, has  smoked int. over the years.    Past Surgical History:  Procedure Laterality Date  . COLONOSCOPY  2012  . TONSILLECTOMY AND ADENOIDECTOMY  1969  . TOTAL ABDOMINAL  HYSTERECTOMY W/ BILATERAL SALPINGOOPHORECTOMY  2004   Fibroid; heavy bleeding.    Family History  Problem Relation Age of Onset  . Diabetes Mother   . Heart disease Mother   . Hypercholesterolemia Mother   . Thyroid disease Mother   . Dementia Mother        mild cognitve impairment  . Diabetes Father   . Heart disease Father   . Early death Father 26  . CVA Father   . Hypertension Sister   . Hyperlipidemia Sister   . Diabetes Brother   . Hypertension Brother   . Hyperlipidemia Brother   . Stroke Maternal Grandmother   . Lung disease Maternal Grandfather        black lung  . Dementia Paternal Grandmother   . Stroke Paternal Grandfather   . Hypertension Sister    Allergies as of 04/08/2018      Reactions   Sulfa Antibiotics Hives   Other Nausea And Vomiting   Pine nuts      Medication List        Accurate as of 04/08/18  9:25 AM. Always use your most recent med list.          aspirin EC 81 MG tablet  Take 81 mg by mouth daily.   atorvastatin 10 MG tablet Commonly known as:  LIPITOR Take 1 tablet (10 mg total) by mouth daily.   doxycycline 100 MG tablet Commonly known as:  VIBRA-TABS Take 1 tablet (100 mg total) by mouth 2 (two) times daily.   lisinopril-hydrochlorothiazide 20-12.5 MG tablet Commonly known as:  PRINZIDE,ZESTORETIC Take 1 tablet by mouth daily.       All past medical history, surgical history, allergies, family history, immunizations andmedications were updated in the EMR today and reviewed under the history and medication portions of their EMR.     ROS: Negative, with the exception of above mentioned in HPI   Objective:  BP 119/78 (BP Location: Right Arm, Patient Position: Sitting, Cuff Size: Normal)   Pulse 84   Resp 16   Ht 5\' 6"  (1.676 m)   Wt 152 lb (68.9 kg)   SpO2 100%   BMI 24.53 kg/m  Body mass index is 24.53 kg/m. Gen: Afebrile. No acute distress. Nontoxic. pleasant Julia Erickson.  HENT: AT. [place]. Bilateral TM visualized and  normal in appearance. MMM.  Eyes:Pupils Equal Round Reactive to light, Extraocular movements intact,  Conjunctiva without redness, discharge or icterus. Skin: No rashes, purpura or petechiae. No drainage or erythema. Hyperpigmented skin over epidermal cyst. Cyst ~ 2cm  In right bra line.  Neuro:  Normal gait. PERLA. EOMi. Alert. Oriented.  No exam data present No results found. No results found for this or any previous visit (from the past 24 hour(s)).  Assessment/Plan: Julia Erickson is a 62 y.o. Julia Erickson present for OV for  Abscess of right breast/epidermal cyst - infection resolved with treatment. Epidermal cyst present in right bra line, still causing some discomfort. Recommend full excision. Referred to gen surg.  - f/u PRN  Reviewed expectations re: course of current medical issues.  Discussed self-management of symptoms.  Outlined signs and symptoms indicating need for more acute intervention.  Patient verbalized understanding and all questions were answered.  Patient received an After-Visit Summary.    No orders of the defined types were placed in this encounter.    Note is dictated utilizing voice recognition software. Although note has been proof read prior to signing, occasional typographical errors still can be missed. If any questions arise, please do not hesitate to call for verification.   electronically signed by:  Felix Pacinienee Jersey Espinoza, DO  [facility] [place]

## 2018-04-08 NOTE — Patient Instructions (Signed)
I have referred you to surgery to have this excised.    Epidermal Cyst An epidermal cyst is a small, painless lump under your skin. It may be called an epidermal inclusion cyst or an infundibular cyst. The cyst contains a grayish-white, bad-smelling substance (keratin). It is important not to pop epidermal cysts yourself. These cysts are usually harmless (benign), but they can get infected. Symptoms of infection may include:  Redness.  Inflammation.  Tenderness.  Warmth.  Fever.  A grayish-white, bad-smelling substance draining from the cyst.  Pus draining from the cyst.  Follow these instructions at home:  Take over-the-counter and prescription medicines only as told by your doctor.  If you were prescribed an antibiotic, use it as told by your doctor. Do not stop using the antibiotic even if you start to feel better.  Keep the area around your cyst clean and dry.  Wear loose, dry clothing.  Do not try to pop your cyst.  Avoid touching your cyst.  Check your cyst every day for signs of infection.  Keep all follow-up visits as told by your doctor. This is important. How is this prevented?  Wear clean, dry, clothing.  Avoid wearing tight clothing.  Keep your skin clean and dry. Shower or take baths every day.  Wash your body with a benzoyl peroxide wash when you shower or bathe. Contact a health care provider if:  Your cyst has symptoms of infection.  Your condition is not improving or is getting worse.  You have a cyst that looks different from other cysts you have had.  You have a fever. Get help right away if:  Redness spreads from the cyst into the surrounding area. This information is not intended to replace advice given to you by your health care provider. Make sure you discuss any questions you have with your health care provider. Document Released: 10/22/2004 Document Revised: 05/13/2016 Document Reviewed: 07/17/2015 Elsevier Interactive Patient  Education  [employer]2018 [employer]

## 2018-04-28 ENCOUNTER — Other Ambulatory Visit: Payer: Self-pay | Admitting: Family Medicine

## 2018-04-28 DIAGNOSIS — I10 Essential (primary) hypertension: Secondary | ICD-10-CM

## 2018-04-28 DIAGNOSIS — E7800 Pure hypercholesterolemia, unspecified: Secondary | ICD-10-CM

## 2018-05-13 ENCOUNTER — Encounter: Payer: Self-pay | Admitting: Family Medicine

## 2018-05-13 ENCOUNTER — Ambulatory Visit (INDEPENDENT_AMBULATORY_CARE_PROVIDER_SITE_OTHER): Payer: BLUE CROSS/BLUE SHIELD | Admitting: Family Medicine

## 2018-05-13 VITALS — BP 115/72 | HR 74 | Temp 97.8°F | Resp 20 | Ht 66.5 in | Wt 150.0 lb

## 2018-05-13 DIAGNOSIS — Z23 Encounter for immunization: Secondary | ICD-10-CM | POA: Diagnosis not present

## 2018-05-13 DIAGNOSIS — R911 Solitary pulmonary nodule: Secondary | ICD-10-CM

## 2018-05-13 DIAGNOSIS — D582 Other hemoglobinopathies: Secondary | ICD-10-CM | POA: Diagnosis not present

## 2018-05-13 DIAGNOSIS — F17200 Nicotine dependence, unspecified, uncomplicated: Secondary | ICD-10-CM | POA: Diagnosis not present

## 2018-05-13 DIAGNOSIS — Z0000 Encounter for general adult medical examination without abnormal findings: Secondary | ICD-10-CM

## 2018-05-13 DIAGNOSIS — Z716 Tobacco abuse counseling: Secondary | ICD-10-CM | POA: Diagnosis not present

## 2018-05-13 DIAGNOSIS — E7800 Pure hypercholesterolemia, unspecified: Secondary | ICD-10-CM | POA: Diagnosis not present

## 2018-05-13 DIAGNOSIS — J439 Emphysema, unspecified: Secondary | ICD-10-CM

## 2018-05-13 DIAGNOSIS — I10 Essential (primary) hypertension: Secondary | ICD-10-CM

## 2018-05-13 DIAGNOSIS — Z131 Encounter for screening for diabetes mellitus: Secondary | ICD-10-CM | POA: Diagnosis not present

## 2018-05-13 DIAGNOSIS — H93A9 Pulsatile tinnitus, unspecified ear: Secondary | ICD-10-CM

## 2018-05-13 DIAGNOSIS — I700 Atherosclerosis of aorta: Secondary | ICD-10-CM | POA: Diagnosis not present

## 2018-05-13 LAB — LIPID PANEL
CHOL/HDL RATIO: 4
Cholesterol: 187 mg/dL (ref 0–200)
HDL: 44.6 mg/dL (ref >39.00)
LDL Cholesterol: 118 mg/dL — ABNORMAL HIGH (ref 0–99)
NONHDL: 142.04
TRIGLYCERIDES: 120 mg/dL (ref 0.0–149.0)
VLDL: 24 mg/dL (ref 0.0–40.0)

## 2018-05-13 LAB — CBC WITH DIFFERENTIAL/PLATELET
BASOS ABS: 0.1 10*3/uL (ref 0.0–0.1)
Basophils Relative: 1.1 % (ref 0.0–3.0)
EOS ABS: 0.1 10*3/uL (ref 0.0–0.7)
Eosinophils Relative: 0.7 % (ref 0.0–5.0)
HEMATOCRIT: 45.9 % (ref 36.0–46.0)
HEMOGLOBIN: 15.4 g/dL — AB (ref 12.0–15.0)
Lymphocytes Relative: 15.8 % (ref 12.0–46.0)
Lymphs Abs: 1.8 10*3/uL (ref 0.7–4.0)
MCHC: 33.5 g/dL (ref 30.0–36.0)
MCV: 97.6 fl (ref 78.0–100.0)
Monocytes Absolute: 0.5 10*3/uL (ref 0.1–1.0)
Monocytes Relative: 4.7 % (ref 3.0–12.0)
NEUTROS ABS: 8.9 10*3/uL — AB (ref 1.4–7.7)
Neutrophils Relative %: 77.7 % — ABNORMAL HIGH (ref 43.0–77.0)
PLATELETS: 279 10*3/uL (ref 150.0–400.0)
RBC: 4.7 Mil/uL (ref 3.87–5.11)
RDW: 13.4 % (ref 11.5–15.5)
WBC: 11.5 10*3/uL — AB (ref 4.0–10.5)

## 2018-05-13 LAB — COMPREHENSIVE METABOLIC PANEL
ALK PHOS: 71 U/L (ref 39–117)
ALT: 12 U/L (ref 0–35)
AST: 13 U/L (ref 0–37)
Albumin: 4.6 g/dL (ref 3.5–5.2)
BILIRUBIN TOTAL: 0.7 mg/dL (ref 0.2–1.2)
BUN: 9 mg/dL (ref 6–23)
CO2: 32 meq/L (ref 19–32)
CREATININE: 0.64 mg/dL (ref 0.40–1.20)
Calcium: 10.3 mg/dL (ref 8.4–10.5)
Chloride: 101 mEq/L (ref 96–112)
GFR: 99.86 mL/min (ref >60.00)
Glucose, Bld: 92 mg/dL (ref 70–99)
Potassium: 4.2 mEq/L (ref 3.5–5.1)
Sodium: 139 mEq/L (ref 135–145)
TOTAL PROTEIN: 6.7 g/dL (ref 6.0–8.3)

## 2018-05-13 LAB — TSH: TSH: 1.48 u[IU]/mL (ref 0.35–4.50)

## 2018-05-13 LAB — HEMOGLOBIN A1C: Hgb A1c MFr Bld: 6 % (ref 4.6–6.5)

## 2018-05-13 MED ORDER — VARENICLINE TARTRATE 0.5 MG PO TABS: ORAL_TABLET | ORAL | 0 refills | Status: DC

## 2018-05-13 MED ORDER — ATORVASTATIN CALCIUM 10 MG PO TABS: 10.0000 mg | ORAL_TABLET | Freq: Every day | ORAL | 3 refills | Status: DC

## 2018-05-13 MED ORDER — LISINOPRIL-HYDROCHLOROTHIAZIDE 20-12.5 MG PO TABS: 1.0000 | ORAL_TABLET | Freq: Every day | ORAL | 1 refills | Status: DC

## 2018-05-13 NOTE — Progress Notes (Signed)
Patient ID: Julia Erickson, female  DOB: 02-10-1956, 62 y.o.   MRN: [id number] Patient Care Team    Relationship Specialty Notifications Start End  Natalia Leatherwood, DO PCP - General Family Medicine  03/30/17   Aline August, NP Nurse Practitioner Gynecology  03/30/17     Chief Complaint  Patient presents with  . Annual Exam    Subjective:  Julia Erickson is a 62 y.o.  Female  present for CPE. All past medical history, surgical history, allergies, family history, immunizations, medications and social history were updated in the electronic medical record today. All recent labs, ED visits and hospitalizations within the last year were reviewed.  Current every day smoker/lung cancer screen: Patient has smoked 1 pack a day for the past 30 years of cigarettes.She has been counseled on smoking cessation. She does meet criteria with 30-pack-year history. She is a current smoker. She does not have current symptoms. She is interested in chantix. She has been prescribed in the past, but reports she never did start. Smokes about 1/2 pack a day now, of basic ultra's. Discussed repeating low dose CT in October and she would like to wait another year.   Pulsatile tinnitus:  She reports "pulsatile tinnitus" of her right ear that is intermittent over the last 2 months. She reports she held her breath and "popped" her ears and it resolved for awhile, but does some back occassionally. She does not report decreased hearing.   Hypertension/HLD/Aortic atherosclerosis: Pt reports ccomplaince with Lisinopril-Hctz and lipitor. Blood pressures ranges at home WNL. Patient denies chest pain, shortness of breath, dizziness or lower extremity edema.   Pt does take a daily baby ASA. Pt is  prescribed statin. BMP: 05/12/2017 within normal limits CBC: 05/12/2017 with mild increase in hemoglobin in a smoker Diet: Low-sodium Exercise: Routine RF: Hypertension, hyperlipidemia, aortic atherosclerosis, smoker, family history  of heart disease, family history stroke  Health maintenance: Updated 05/13/18 Colonoscopy: completed 2012, in [place]. Patient reports normal, 10 year follow-up. Mammogram: completed: 02/2017; birads 1 completed at gynecology Cervical cancer screening: last pap: 2018, results: Normal per patient, completed by: Gynecologist Immunizations: tdap 2016, Influenza(encouraged yearly), Pneumovax 23 05/12/2017 and prevnar completed today. Shingrix#1 today. Infectious disease screening: HIV completed  andHep C completed today.  DEXA: Patient reports DEXA scan approximately 5 years ago, and was normal. She has been estrogen deficient for some time, with a hysterectomy/oophorectomy. Pt will have completed with Mammogram 2019 at GYN Assistive device: none Oxygen [id number]:[id number] Patient has a Dental home. Hospitalizations/ED visits: reviewed  Depression screen [facility] 2/9 05/13/2018 03/22/2018 10/15/2017 05/12/2017 03/30/2017  Decreased Interest 0 0 0 0 0  Down, Depressed, Hopeless 0 0 0 0 0  PHQ - 2 Score 0 0 0 0 0   No flowsheet data found.   Current Exercise Habits: The patient does not participate in regular exercise at present Exercise limited by: None identified   Immunization History  Administered Date(s) Administered  . Influenza,inj,Quad PF,6+ Mos 10/15/2017  . Pneumococcal Conjugate-13 05/13/2018  . Pneumococcal Polysaccharide-23 05/12/2017  . Zoster Recombinat (Shingrix) 05/13/2018     Past Medical History:  Diagnosis Date  . Allergy   . Chickenpox   . Elevated LDL cholesterol level   . Hypertension   . Smoker    Started about age 25, has  smoked int. over the years.    Allergies  Allergen Reactions  . Sulfa Antibiotics Hives  . Other Nausea And Vomiting    Pine nuts  Past Surgical History:  Procedure Laterality Date  . COLONOSCOPY  2012  . TONSILLECTOMY AND ADENOIDECTOMY  1969  . TOTAL ABDOMINAL HYSTERECTOMY W/ BILATERAL SALPINGOOPHORECTOMY  2004   Fibroid; heavy bleeding.      Family History  Problem Relation Age of Onset  . Diabetes Mother   . Heart disease Mother   . Hypercholesterolemia Mother   . Thyroid disease Mother   . Dementia Mother        mild cognitve impairment  . Diabetes Father   . Heart disease Father   . Early death Father 57  . CVA Father   . Hypertension Sister   . Hyperlipidemia Sister   . Diabetes Brother   . Hypertension Brother   . Hyperlipidemia Brother   . Stroke Maternal Grandmother   . Lung disease Maternal Grandfather        black lung  . Dementia Paternal Grandmother   . Stroke Paternal Grandfather   . Hypertension Sister    Social History   Socioeconomic History  . Marital status: Married    Spouse name: Julia Erickson  . Number of children: 4  . Years of education: 84  . Highest education level: Not on file  Occupational History  . Occupation: retired  Engineer, production  . Financial resource strain: Not on file  . Food insecurity:    Worry: Not on file    Inability: Not on file  . Transportation needs:    Medical: Not on file    Non-medical: Not on file  Tobacco Use  . Smoking status: Current Every Day Smoker    Packs/day: 1.00    Years: 30.00    Pack years: 30.00  . Smokeless tobacco: Never Used  Substance and Sexual Activity  . Alcohol use: Yes    Alcohol/week: 1.0 standard drinks    Types: 1 Glasses of wine per week  . Drug use: No  . Sexual activity: Yes    Partners: Male  Lifestyle  . Physical activity:    Days per week: Not on file    Minutes per session: Not on file  . Stress: Not on file  Relationships  . Social connections:    Talks on phone: Not on file    Gets together: Not on file    Attends religious service: Not on file    Active member of club or organization: Not on file    Attends meetings of clubs or organizations: Not on file    Relationship status: Not on file  . Intimate partner violence:    Fear of current or ex partner: Not on file    Emotionally abused: Not on file     Physically abused: Not on file    Forced sexual activity: Not on file  Other Topics Concern  . Not on file  Social History Narrative   Married to [place]. They have 4 children.   BS. Retired Armed forces logistics/support/administrative officer.   Drinks caffeine.   Wears her seatbelt. Wears a bicycle helmet. Smoke detector in the home.   Feels safe in her relationships.   Allergies as of 05/13/2018      Reactions   Sulfa Antibiotics Hives   Other Nausea And Vomiting   Pine nuts      Medication List        Accurate as of 05/13/18  2:55 PM. Always use your most recent med list.          aspirin EC 81 MG tablet Take 81 mg by mouth  daily. Takes M-W-F   atorvastatin 10 MG tablet Commonly known as:  LIPITOR Take 1 tablet (10 mg total) by mouth daily.   lisinopril-hydrochlorothiazide 20-12.5 MG tablet Commonly known as:  PRINZIDE,ZESTORETIC Take 1 tablet by mouth daily.   varenicline 0.5 MG tablet Commonly known as:  CHANTIX Day 1-3 0.5mg  QD, day 4-7 0.5mg  BID, then 1 mg BID       All past medical history, surgical history, allergies, family history, immunizations andmedications were updated in the EMR today and reviewed under the history and medication portions of their EMR.     Recent Results (from the past 2160 hour(s))  Wound culture     Status: None   Collection Time: 03/22/18 10:44 AM  Result Value Ref Range   MICRO NUMBER: [id number]    SPECIMEN QUALITY: ADEQUATE    SOURCE: WOUND (SITE NOT SPECIFIED)    STATUS: FINAL    GRAM STAIN:      Many White blood cells seen No epithelial cells seen Moderate Gram positive cocci in pairs   RESULT:      Growth of skin flora (note: Growth does not include S. aureus, beta-hemolytic Streptococci or P. aeruginosa).    Ct Chest Lung Cancer Screening Low Dose Wo Contrast  Result Date: 07/20/2017 CLINICAL DATA:  Lung cancer screening. EXAM: CT CHEST WITHOUT CONTRAST LOW-DOSE FOR LUNG CANCER SCREENING TECHNIQUE: Multidetector CT imaging of the chest was performed  following the standard protocol without IV contrast. COMPARISON:  None. FINDINGS: Cardiovascular: Normal heart size. Aortic atherosclerosis. No pericardial effusion. Mediastinum/Nodes: No enlarged mediastinal, hilar, or axillary lymph nodes. Thyroid gland, trachea, and esophagus demonstrate no significant findings. Lungs/Pleura: Mild changes of centrilobular emphysema. Pulmonary nodule in the right upper lobe has an equivalent diameter of 3 mm. Upper Abdomen: No acute abnormality. 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 No aggressive lytic or sclerotic bone lesions identified. IMPRESSION: 1. Lung-RADS 2, benign appearance or behavior. Continue annual screening with low-dose chest CT without contrast in 12 months. 2. Aortic Atherosclerosis (ICD10-I70.0) and Emphysema (ICD10-J43.9). Electronically Signed   By: Signa Kellaylor  Stroud M.D.   On: 07/20/2017 11:33     ROS: 14 pt review of systems performed and negative (unless mentioned in an HPI)  Objective: BP 115/72 (BP Location: Right Arm, Patient Position: Sitting, Cuff Size: Normal)   Pulse 74   Temp 97.8 F (36.6 C)   Resp 20   Ht 5' 6.5" (1.689 m)   Wt 150 lb (68 kg)   SpO2 100%   BMI 23.85 kg/m  Gen: Afebrile. No acute distress. Nontoxic in appearance, well-developed, well-nourished,  pleasant caucasian female.  HENT: AT. [place]. Bilateral TM visualized and normal in appearance- only mild fullness, normal external auditory canal. MMM, no oral lesions, adequate dentition. Bilateral nares within normal limits. Throat without erythema, ulcerations or exudates. no Cough on exam, no hoarseness on exam. Eyes:Pupils Equal Round Reactive to light, Extraocular movements intact,  Conjunctiva without redness, discharge or icterus. Neck/lymp/endocrine: Supple,no lymphadenopathy, no thyromegaly CV: RRR no murmur, no edema, +2/4 P posterior tibialis pulses. no carotid bruits. No JVD. Chest: CTAB, no wheeze, rhonchi or crackles.  normal Respiratory effort. good Air movement. Abd: Soft. flat. NTND. BS present. no Masses palpated. No hepatosplenomegaly. No rebound tenderness or guarding. Skin: no rashes, purpura or petechiae. Warm and well-perfused. Skin intact. Neuro/Msk:  Normal gait. PERLA. EOMi. Alert. Oriented x3.  Cranial nerves II through XII intact. Muscle strength 5/5 upper/lower extremity. DTRs equal bilaterally. Psych: Normal affect, dress and  demeanor. Normal speech. Normal thought content and judgment.  No exam data present  Assessment/plan: Julia Coinlice Gautreau is a 62 y.o. female present for CPE  Essential hypertension/HLD/Aortic atherosclerosis (HCC) - Stable today. Continue/refilled Lipitor and lisinopril-HCTZ.  - Continue daily baby ASA - Lipid panel - TSH - lisinopril-hydrochlorothiazide (PRINZIDE,ZESTORETIC) 20-12.5 MG tablet; Take 1 tablet by mouth daily.  Dispense: 90 tablet; Refill: 1 - atorvastatin (LIPITOR) 10 MG tablet; Take 1 tablet (10 mg total) by mouth daily.  Dispense: 90 tablet; Refill: 1 - F/U 6 mos.  Pulsatile tinnitus:  - New. start with flonase and antihistamine. Exam has some ear fullness. Currently no tinnitus or bruit. No hearing changes or dizziness. If symptoms are nor resolved or worsen f/u 4 weeks.   Pulmonary emphysema, unspecified emphysema type (HCC)/pulm nodule/smoking cessation/current smoker - yearly LDCT for pul nodule is recommended, she is aware of recs but would like to wait until next year--> 2020 -Patient was asked about smoking history. They were advised  to quit smoking in a clear, strong and personalized manner. Their willingness to quit smoking was assessed and felt to be in  Preparation  phase. They were offered assistance in cessation and options were explained to them today.  Patient decided to retry chantix--> she has had scripts but never actually started med.  9 minutes were spent today in counseling.  Pt advised to follow up 1 month after chantix started    Screening for diabetes mellitus - HgB A1C Elevated hemoglobin (HCC) - CBC w/Diff Immunization due - Varicella-zoster vaccine IM - Pneumococcal conjugate vaccine 13-valent Encounter for preventive health examination Patient was encouraged to exercise greater than 150 minutes a week. Patient was encouraged to choose a diet filled with fresh fruits and vegetables, and lean meats. AVS provided to patient today for education/recommendation on gender specific health and safety maintenance. Colonoscopy: Due 2022 Mammogram/PAP: completed: 02/2017 --> following with gyn Immunizations: UTD and  prevnar completed today. Shingrix#1 today. Infectious disease screening: HIV completed  andHep C completed today.  DEXA: advised her to see if her GYN will order with her Mammogram, if not we can place order for her to have at an image center.   Return for CPE. 1 year, sooner if needed.  6 mos [facility].  2-6 mos for nurse visit shingrix #2  CPE + chronic medical conditions and new complaint covered today -added level 3 visit to cover chronic problems and new problem > 15 additional face time minutes was spent with patient covering these issues.   Electronically signed by: Julia Pacinienee Kayson Bullis, DO [facility]

## 2018-05-13 NOTE — Patient Instructions (Addendum)
Try flonase daily. If that does not resolve the issue, then follow up in 4 weeks.  I will call in chantix.  I have refilled your meds.    Health Maintenance, Female Adopting a healthy lifestyle and getting preventive care can go a long way to promote health and wellness. Talk with your health care provider about what schedule of regular examinations is right for you. This is a good chance for you to check in with your provider about disease prevention and staying healthy. In between checkups, there are plenty of things you can do on your own. Experts have done a lot of research about which lifestyle changes and preventive measures are most likely to keep you healthy. Ask your health care provider for more information. Weight and diet Eat a healthy diet  Be sure to include plenty of vegetables, fruits, low-fat dairy products, and lean protein.  Do not eat a lot of foods high in solid fats, added sugars, or salt.  Get regular exercise. This is one of the most important things you can do for your health. ? Most adults should exercise for at least 150 minutes each week. The exercise should increase your heart rate and make you sweat (moderate-intensity exercise). ? Most adults should also do strengthening exercises at least twice a week. This is in addition to the moderate-intensity exercise.  Maintain a healthy weight  Body mass index (BMI) is a measurement that can be used to identify possible weight problems. It estimates body fat based on height and weight. Your health care provider can help determine your BMI and help you achieve or maintain a healthy weight.  For females 70 years of age and older: ? A BMI below 18.5 is considered underweight. ? A BMI of 18.5 to 24.9 is normal. ? A BMI of 25 to 29.9 is considered overweight. ? A BMI of 30 and above is considered obese.  Watch levels of cholesterol and blood lipids  You should start having your blood tested for lipids and cholesterol  at 62 years of age, then have this test every 5 years.  You may need to have your cholesterol levels checked more often if: ? Your lipid or cholesterol levels are high. ? You are older than 62 years of age. ? You are at high risk for heart disease.  Cancer screening Lung Cancer  Lung cancer screening is recommended for adults 4-21 years old who are at high risk for lung cancer because of a history of smoking.  A yearly low-dose CT scan of the lungs is recommended for people who: ? Currently smoke. ? Have quit within the past 15 years. ? Have at least a 30-pack-year history of smoking. A pack year is smoking an average of one pack of cigarettes a day for 1 year.  Yearly screening should continue until it has been 15 years since you quit.  Yearly screening should stop if you develop a health problem that would prevent you from having lung cancer treatment.  Breast Cancer  Practice breast self-awareness. This means understanding how your breasts normally appear and feel.  It also means doing regular breast self-exams. Let your health care provider know about any changes, no matter how small.  If you are in your 20s or 30s, you should have a clinical breast exam (CBE) by a health care provider every 1-3 years as part of a regular health exam.  If you are 75 or older, have a CBE every year. Also consider having a  breast X-ray (mammogram) every year.  If you have a family history of breast cancer, talk to your health care provider about genetic screening.  If you are at high risk for breast cancer, talk to your health care provider about having an MRI and a mammogram every year.  Breast cancer gene (BRCA) assessment is recommended for women who have family members with BRCA-related cancers. BRCA-related cancers include: ? Breast. ? Ovarian. ? Tubal. ? Peritoneal cancers.  Results of the assessment will determine the need for genetic counseling and BRCA1 and BRCA2  testing.  Cervical Cancer Your health care provider may recommend that you be screened regularly for cancer of the pelvic organs (ovaries, uterus, and vagina). This screening involves a pelvic examination, including checking for microscopic changes to the surface of your cervix (Pap test). You may be encouraged to have this screening done every 3 years, beginning at age 56.  For women ages 52-65, health care providers may recommend pelvic exams and Pap testing every 3 years, or they may recommend the Pap and pelvic exam, combined with testing for human papilloma virus (HPV), every 5 years. Some types of HPV increase your risk of cervical cancer. Testing for HPV may also be done on women of any age with unclear Pap test results.  Other health care providers may not recommend any screening for nonpregnant women who are considered low risk for pelvic cancer and who do not have symptoms. Ask your health care provider if a screening pelvic exam is right for you.  If you have had past treatment for cervical cancer or a condition that could lead to cancer, you need Pap tests and screening for cancer for at least 20 years after your treatment. If Pap tests have been discontinued, your risk factors (such as having a new sexual partner) need to be reassessed to determine if screening should resume. Some women have medical problems that increase the chance of getting cervical cancer. In these cases, your health care provider may recommend more frequent screening and Pap tests.  Colorectal Cancer  This type of cancer can be detected and often prevented.  Routine colorectal cancer screening usually begins at 62 years of age and continues through 62 years of age.  Your health care provider may recommend screening at an earlier age if you have risk factors for colon cancer.  Your health care provider may also recommend using home test kits to check for hidden blood in the stool.  A small camera at the end of a  tube can be used to examine your colon directly (sigmoidoscopy or colonoscopy). This is done to check for the earliest forms of colorectal cancer.  Routine screening usually begins at age 82.  Direct examination of the colon should be repeated every 5-10 years through 62 years of age. However, you may need to be screened more often if early forms of precancerous polyps or small growths are found.  Skin Cancer  Check your skin from head to toe regularly.  Tell your health care provider about any new moles or changes in moles, especially if there is a change in a mole's shape or color.  Also tell your health care provider if you have a mole that is larger than the size of a pencil eraser.  Always use sunscreen. Apply sunscreen liberally and repeatedly throughout the day.  Protect yourself by wearing long sleeves, pants, a wide-brimmed hat, and sunglasses whenever you are outside.  Heart disease, diabetes, and high blood pressure  High  blood pressure causes heart disease and increases the risk of stroke. High blood pressure is more likely to develop in: ? People who have blood pressure in the high end of the normal range (130-139/85-89 mm Hg). ? People who are overweight or obese. ? People who are African American.  If you are 80-3 years of age, have your blood pressure checked every 3-5 years. If you are 28 years of age or older, have your blood pressure checked every year. You should have your blood pressure measured twice-once when you are at a hospital or clinic, and once when you are not at a hospital or clinic. Record the average of the two measurements. To check your blood pressure when you are not at a hospital or clinic, you can use: ? An automated blood pressure machine at a pharmacy. ? A home blood pressure monitor.  If you are between 14 years and 15 years old, ask your health care provider if you should take aspirin to prevent strokes.  Have regular diabetes screenings. This  involves taking a blood sample to check your fasting blood sugar level. ? If you are at a normal weight and have a low risk for diabetes, have this test once every three years after 62 years of age. ? If you are overweight and have a high risk for diabetes, consider being tested at a younger age or more often. Preventing infection Hepatitis B  If you have a higher risk for hepatitis B, you should be screened for this virus. You are considered at high risk for hepatitis B if: ? You were born in a country where hepatitis B is common. Ask your health care provider which countries are considered high risk. ? Your parents were born in a high-risk country, and you have not been immunized against hepatitis B (hepatitis B vaccine). ? You have HIV or AIDS. ? You use needles to inject street drugs. ? You live with someone who has hepatitis B. ? You have had sex with someone who has hepatitis B. ? You get hemodialysis treatment. ? You take certain medicines for conditions, including cancer, organ transplantation, and autoimmune conditions.  Hepatitis C  Blood testing is recommended for: ? Everyone born from [birth year] through 1965. ? Anyone with known risk factors for hepatitis C.  Sexually transmitted infections (STIs)  You should be screened for sexually transmitted infections (STIs) including gonorrhea and chlamydia if: ? You are sexually active and are younger than 62 years of age. ? You are older than 62 years of age and your health care provider tells you that you are at risk for this type of infection. ? Your sexual activity has changed since you were last screened and you are at an increased risk for chlamydia or gonorrhea. Ask your health care provider if you are at risk.  If you do not have HIV, but are at risk, it may be recommended that you take a prescription medicine daily to prevent HIV infection. This is called pre-exposure prophylaxis (PrEP). You are considered at risk if: ? You are  sexually active and do not regularly use condoms or know the HIV status of your partner(s). ? You take drugs by injection. ? You are sexually active with a partner who has HIV.  Talk with your health care provider about whether you are at high risk of being infected with HIV. If you choose to begin PrEP, you should first be tested for HIV. You should then be tested every 3 months for  as long as you are taking PrEP. Pregnancy  If you are premenopausal and you may become pregnant, ask your health care provider about preconception counseling.  If you may become pregnant, take 400 to 800 micrograms (mcg) of folic acid every day.  If you want to prevent pregnancy, talk to your health care provider about birth control (contraception). Osteoporosis and menopause  Osteoporosis is a disease in which the bones lose minerals and strength with aging. This can result in serious bone fractures. Your risk for osteoporosis can be identified using a bone density scan.  If you are 70 years of age or older, or if you are at risk for osteoporosis and fractures, ask your health care provider if you should be screened.  Ask your health care provider whether you should take a calcium or vitamin D supplement to lower your risk for osteoporosis.  Menopause may have certain physical symptoms and risks.  Hormone replacement therapy may reduce some of these symptoms and risks. Talk to your health care provider about whether hormone replacement therapy is right for you. Follow these instructions at home:  Schedule regular health, dental, and eye exams.  Stay current with your immunizations.  Do not use any tobacco products including cigarettes, chewing tobacco, or electronic cigarettes.  If you are pregnant, do not drink alcohol.  If you are breastfeeding, limit how much and how often you drink alcohol.  Limit alcohol intake to no more than 1 drink per day for nonpregnant women. One drink equals 12 ounces of  beer, 5 ounces of wine, or 1 ounces of hard liquor.  Do not use street drugs.  Do not share needles.  Ask your health care provider for help if you need support or information about quitting drugs.  Tell your health care provider if you often feel depressed.  Tell your health care provider if you have ever been abused or do not feel safe at home. This information is not intended to replace advice given to you by your health care provider. Make sure you discuss any questions you have with your health care provider. Document Released: 03/30/2011 Document Revised: 02/20/2016 Document Reviewed: 06/18/2015 Elsevier Interactive Patient Education  [employer].

## 2018-05-16 ENCOUNTER — Telehealth: Payer: Self-pay | Admitting: Family Medicine

## 2018-05-16 NOTE — Telephone Encounter (Signed)
Patient notified and verbalized understanding. 

## 2018-05-16 NOTE — Telephone Encounter (Signed)
Please inform patient the following information: Her labs are normal, with the following exceptions:   - hgb is higher than normal--> this is from smoking.     -  A1c/DM screen higher than her normal at 6.0, which is "prediabetes". Increase exercise, cut back on sugar and carbohydrates.    - she also had a mild elevation in her WBC count, which may be a slight infection. If she develops fever, chills, cough or signs of of an illness return for further evaluation.   We will follow up with rpt DM screening at her appt in 6 mos for other chronic medical conditions.

## 2018-08-17 ENCOUNTER — Ambulatory Visit (INDEPENDENT_AMBULATORY_CARE_PROVIDER_SITE_OTHER): Payer: BLUE CROSS/BLUE SHIELD | Admitting: *Deleted

## 2018-08-17 DIAGNOSIS — Z23 Encounter for immunization: Secondary | ICD-10-CM

## 2018-08-17 NOTE — Progress Notes (Addendum)
Julia Erickson is a 62 y.o. female presents to the office today for her 2nd Shingrix vaccine, per physician's orders. Original order: 05/13/18 Shingrix, 0.785mL, IM was administered RD today.  Pt tolerated injection well, given without incident or problem. Patient left without complaint.    Pryor OchoaHeather Jlyn Cerros   Medical screening examination/treatment/procedure(s) were performed by non-physician practitioner and as supervising physician I was immediately available for consultation/collaboration.  I agree with above assessment and plan.  Electronically Signed by: Felix Pacinienee Kuneff, DO [facility] [place]

## 2018-09-01 ENCOUNTER — Other Ambulatory Visit: Payer: Self-pay | Admitting: Family Medicine

## 2018-09-01 ENCOUNTER — Other Ambulatory Visit: Payer: Self-pay | Admitting: *Deleted

## 2018-09-01 DIAGNOSIS — Z1231 Encounter for screening mammogram for malignant neoplasm of breast: Secondary | ICD-10-CM

## 2018-09-01 DIAGNOSIS — Z13820 Encounter for screening for osteoporosis: Secondary | ICD-10-CM

## 2018-10-12 ENCOUNTER — Ambulatory Visit: Payer: BLUE CROSS/BLUE SHIELD

## 2018-10-28 ENCOUNTER — Ambulatory Visit
Admission: RE | Admit: 2018-10-28 | Discharge: 2018-10-28 | Disposition: A | Discharge status: Home / Self Care | Payer: BLUE CROSS/BLUE SHIELD | Source: Ambulatory Visit | Attending: Family Medicine | Admitting: Family Medicine

## 2018-10-28 ENCOUNTER — Encounter: Payer: Self-pay | Admitting: Family Medicine

## 2018-10-28 DIAGNOSIS — Z1231 Encounter for screening mammogram for malignant neoplasm of breast: Secondary | ICD-10-CM | POA: Diagnosis not present

## 2018-10-28 DIAGNOSIS — Z13820 Encounter for screening for osteoporosis: Secondary | ICD-10-CM

## 2018-10-28 DIAGNOSIS — M85852 Other specified disorders of bone density and structure, left thigh: Secondary | ICD-10-CM | POA: Diagnosis not present

## 2018-10-28 DIAGNOSIS — M8580 Other specified disorders of bone density and structure, unspecified site: Secondary | ICD-10-CM | POA: Insufficient documentation

## 2018-10-28 DIAGNOSIS — Z780 Asymptomatic menopausal state: Secondary | ICD-10-CM | POA: Diagnosis not present

## 2018-11-28 ENCOUNTER — Ambulatory Visit: Payer: BLUE CROSS/BLUE SHIELD | Admitting: Family Medicine

## 2018-11-28 ENCOUNTER — Encounter: Payer: Self-pay | Admitting: Family Medicine

## 2018-11-28 VITALS — BP 111/71 | HR 87 | Temp 98.0°F | Resp 16 | Ht 67.0 in | Wt 151.4 lb

## 2018-11-28 DIAGNOSIS — E7800 Pure hypercholesterolemia, unspecified: Secondary | ICD-10-CM | POA: Diagnosis not present

## 2018-11-28 DIAGNOSIS — I10 Essential (primary) hypertension: Secondary | ICD-10-CM | POA: Diagnosis not present

## 2018-11-28 DIAGNOSIS — I700 Atherosclerosis of aorta: Secondary | ICD-10-CM

## 2018-11-28 DIAGNOSIS — J439 Emphysema, unspecified: Secondary | ICD-10-CM

## 2018-11-28 DIAGNOSIS — F17200 Nicotine dependence, unspecified, uncomplicated: Secondary | ICD-10-CM

## 2018-11-28 DIAGNOSIS — R911 Solitary pulmonary nodule: Secondary | ICD-10-CM | POA: Diagnosis not present

## 2018-11-28 DIAGNOSIS — R7309 Other abnormal glucose: Secondary | ICD-10-CM | POA: Insufficient documentation

## 2018-11-28 MED ORDER — LISINOPRIL-HYDROCHLOROTHIAZIDE 20-12.5 MG PO TABS: 1.0000 | ORAL_TABLET | Freq: Every day | ORAL | 2 refills | Status: DC

## 2018-11-28 MED ORDER — LISINOPRIL-HYDROCHLOROTHIAZIDE 20-12.5 MG PO TABS: 1.0000 | ORAL_TABLET | Freq: Every day | ORAL | 1 refills | Status: DC

## 2018-11-28 NOTE — Progress Notes (Signed)
Patient ID: Julia Erickson, female  DOB: 1956-02-22, 63 y.o.   MRN: [id number] Patient Care Team    Relationship Specialty Notifications Start End  Natalia Leatherwood, DO PCP - General Family Medicine  03/30/17   Aline August, NP Nurse Practitioner Gynecology  03/30/17     Chief Complaint  Patient presents with  . Hypertension    Not fasting. No complaints today.     Subjective:  Julia Erickson is a 63 y.o.  Female  present for HTN follow up- She is moving back to [place].   Hypertension/HLD/Aortic atherosclerosis: Pt reports compliance with Lisinopril-Hctz and lipitor. Blood pressures ranges at home within normal limits. Patient denies chest pain, shortness of breath, dizziness or lower extremity edema.  Pt does take a daily baby ASA. Pt is  prescribed statin. BMP: 05/13/2018 within normal limits CBC: 05/13/2018 with mild increase in hemoglobin in a smoker Lipids: 05/13/2018 total cholesterol 187, LDL 118, HDL 44, triglycerides 120 TSH: 05/13/2018 within normal limits Diet: Low-sodium Exercise: Routine RF: Hypertension, hyperlipidemia, aortic atherosclerosis, smoker, family history of heart disease, family history stroke   Current every day smoker/lung cancer screen/lung nodule: Patient has smoked 1 pack a day for the past 30 years of cigarettes.She has been counseled on smoking cessation. She does meet criteria with 30-pack-year history. She is a current smoker. She does not have current symptoms. She is interested in chantix. She has been prescribed in the past, but reports she never did start. Smokes about 1/2 pack a day now, of basic ultra's.   Still smoking.  She picked up the Chantix prescription but never started.  We elected to wait on repeat CT on her pulmonary nodules until October 2020.   Depression screen [facility] 2/9 05/13/2018 03/22/2018 10/15/2017 05/12/2017 03/30/2017  Decreased Interest 0 0 0 0 0  Down, Depressed, Hopeless 0 0 0 0 0  PHQ - 2 Score 0 0 0 0 0   No  flowsheet data found.  Immunization History  Administered Date(s) Administered  . Influenza,inj,Quad PF,6+ Mos 10/15/2017  . Pneumococcal Conjugate-13 05/13/2018  . Pneumococcal Polysaccharide-23 05/12/2017  . Zoster Recombinat (Shingrix) 05/13/2018, 08/17/2018    Past Medical History:  Diagnosis Date  . Allergy   . Chickenpox   . Elevated LDL cholesterol level   . Hypertension   . Smoker    Started about age 63, has  smoked int. over the years.    Allergies  Allergen Reactions  . Sulfa Antibiotics Hives  . Other Nausea And Vomiting    Pine nuts   Past Surgical History:  Procedure Laterality Date  . COLONOSCOPY  2012  . TONSILLECTOMY AND ADENOIDECTOMY  1969  . TOTAL ABDOMINAL HYSTERECTOMY W/ BILATERAL SALPINGOOPHORECTOMY  2004   Fibroid; heavy bleeding.    Family History  Problem Relation Age of Onset  . Diabetes Mother   . Heart disease Mother   . Hypercholesterolemia Mother   . Thyroid disease Mother   . Dementia Mother        mild cognitve impairment  . Diabetes Father   . Heart disease Father   . Early death Father 51  . CVA Father   . Hypertension Sister   . Hyperlipidemia Sister   . Diabetes Brother   . Hypertension Brother   . Hyperlipidemia Brother   . Stroke Maternal Grandmother   . Lung disease Maternal Grandfather        black lung  . Dementia Paternal Grandmother   . Stroke  Paternal Grandfather   . Hypertension Sister    Social History   Socioeconomic History  . Marital status: Married    Spouse name: Rocky Link  . Number of children: 4  . Years of education: 68  . Highest education level: Not on file  Occupational History  . Occupation: retired  Engineer, production  . Financial resource strain: Not on file  . Food insecurity:    Worry: Not on file    Inability: Not on file  . Transportation needs:    Medical: Not on file    Non-medical: Not on file  Tobacco Use  . Smoking status: Current Every Day Smoker    Packs/day: 1.00    Years: 30.00     Pack years: 30.00  . Smokeless tobacco: Never Used  Substance and Sexual Activity  . Alcohol use: Yes    Alcohol/week: 1.0 standard drinks    Types: 1 Glasses of wine per week  . Drug use: No  . Sexual activity: Yes    Partners: Male  Lifestyle  . Physical activity:    Days per week: Not on file    Minutes per session: Not on file  . Stress: Not on file  Relationships  . Social connections:    Talks on phone: Not on file    Gets together: Not on file    Attends religious service: Not on file    Active member of club or organization: Not on file    Attends meetings of clubs or organizations: Not on file    Relationship status: Not on file  . Intimate partner violence:    Fear of current or ex partner: Not on file    Emotionally abused: Not on file    Physically abused: Not on file    Forced sexual activity: Not on file  Other Topics Concern  . Not on file  Social History Narrative   Married to [place]. They have 4 children.   BS. Retired Armed forces logistics/support/administrative officer.   Drinks caffeine.   Wears her seatbelt. Wears a bicycle helmet. Smoke detector in the home.   Feels safe in her relationships.   Allergies as of 11/28/2018      Reactions   Sulfa Antibiotics Hives   Other Nausea And Vomiting   Pine nuts      Medication List       Accurate as of November 28, 2018  9:06 AM. Always use your most recent med list.        aspirin EC 81 MG tablet Take 81 mg by mouth daily. Takes M-W-F   atorvastatin 10 MG tablet Commonly known as:  LIPITOR Take 1 tablet (10 mg total) by mouth daily.   cholecalciferol 25 MCG (1000 UT) tablet Commonly known as:  VITAMIN D3 Take 1,000 Units by mouth daily.   lisinopril-hydrochlorothiazide 20-12.5 MG tablet Commonly known as:  PRINZIDE,ZESTORETIC Take 1 tablet by mouth daily.       All past medical history, surgical history, allergies, family history, immunizations andmedications were updated in the EMR today and reviewed under the history and  medication portions of their EMR.     No results found for this or any previous visit (from the past 2160 hour(s)).  ROS: 14 pt review of systems performed and negative (unless mentioned in an HPI)  Objective: BP 111/71 (BP Location: Left Arm, Patient Position: Sitting, Cuff Size: Normal)   Pulse 87   Temp 98 F (36.7 C) (Oral)   Resp 16   Ht  5\' 7"  (1.702 m)   Wt 151 lb 6 oz (68.7 kg)   SpO2 99%   BMI 23.71 kg/m  Gen: Afebrile. No acute distress.  Nontoxic, pleasant Caucasian female. HENT: AT. [place].  MMM.  Eyes:Pupils Equal Round Reactive to light, Extraocular movements intact,  Conjunctiva without redness, discharge or icterus. Neck/lymp/endocrine: Supple, no lymphadenopathy, no thyromegaly CV: RRR no murmur, no edema, +2/4 P posterior tibialis pulses Chest: CTAB, no wheeze or crackles Abd: Soft. NTND. BS present.  No masses palpated.  Skin: No rashes, purpura or petechiae.  Neuro: Normal gait. PERLA. EOMi. Alert. Oriented x3  Psych: Normal affect, dress and demeanor. Normal speech. Normal thought content and judgment.   No exam data present  Assessment/plan: Delancey Farhan is a 63 y.o. female present for CPE. Essential hypertension/HLD/Aortic atherosclerosis (HCC) -Stable today. Refills on Lipitor and lisinopril-HCTZ (9 months provided to her to cover during move) - Continue daily baby ASA -Continue statin -Labs up-to-date - lisinopril-hydrochlorothiazide (PRINZIDE,ZESTORETIC) 20-12.5 MG tablet; Take 1 tablet by mouth daily.  Dispense: 90 tablet; Refill: 1 - atorvastatin (LIPITOR) 10 MG tablet; Take 1 tablet (10 mg total) by mouth daily.  Dispense: 90 tablet; Refill: 1  Pulmonary emphysema, unspecified emphysema type (HCC)/pulm nodule/current smoker - yearly LDCT for pul nodule is recommended, she is aware of recs but would like to wait until next year--> 06/2019 - continue to cut back on smoking.  She is moving back to [place] - Follow with new PCP   Electronically  signed by: Julia Pacini, DO [facility] [place]

## 2018-11-28 NOTE — Patient Instructions (Signed)
Good luck on your move!!!   I have refilled your meds for 9 months.

## 2019-01-26 ENCOUNTER — Other Ambulatory Visit: Payer: Self-pay

## 2019-01-26 ENCOUNTER — Encounter: Payer: Self-pay | Admitting: Family Medicine

## 2019-01-26 ENCOUNTER — Ambulatory Visit (INDEPENDENT_AMBULATORY_CARE_PROVIDER_SITE_OTHER): Payer: BLUE CROSS/BLUE SHIELD | Admitting: Family Medicine

## 2019-01-26 VITALS — BP 131/72 | HR 69 | Temp 98.2°F | Ht 67.0 in | Wt 150.0 lb

## 2019-01-26 DIAGNOSIS — H0011 Chalazion right upper eyelid: Secondary | ICD-10-CM

## 2019-01-26 MED ORDER — ERYTHROMYCIN 5 MG/GM OP OINT: 1.0000 "application " | TOPICAL_OINTMENT | Freq: Every day | OPHTHALMIC | 0 refills | Status: DC

## 2019-01-26 NOTE — Progress Notes (Signed)
VIRTUAL VISIT VIA VIDEO  I connected with Theador Hawthorne Pak on 01/26/19 at 10:40 AM EDT by a video enabled telemedicine application and verified that I am speaking with the correct person using two identifiers. Location patient: Home Location provider: [facility], Office Persons participating in the virtual visit: Patient, Dr. Claiborne Billings and R.Baker, LPN  I discussed the limitations of evaluation and management by telemedicine and the availability of in person appointments. The patient expressed understanding and agreed to proceed.   SUBJECTIVE Chief Complaint  Patient presents with  . Mass    bump on eyelid.  States she had a sty a few weeks ago, resolved however this bump is on her eyelid.     HPI:  Julia Erickson is a 63 y.o. present today to discuss a bump located on her eyelid.  She states she had a stye 3 weeks ago that resolved below the location of her current concern.  He had been using warm compresses daily and the stye completely resolved within a few days.  Since that time she has a round mass mid upper eyelid.  She states the size waxes and wanes by today.  When it is large she can make her vision slightly blurry.  Heat helps decrease the mass.  Warm showers almost completely resolved the mass but then it returns.  She denies any fever, chills, redness or inflammation surrounding the mass or eye drainage/discharge.  ROS: See pertinent positives and negatives per HPI.  Patient Active Problem List   Diagnosis Date Noted  . Elevated hemoglobin A1c 11/28/2018  . Osteopenia 10/28/2018  . Pulsatile tinnitus 05/13/2018  . Solitary pulmonary nodule on lung CT 07/20/2017  . Emphysema lung (HCC) 07/20/2017  . Aortic atherosclerosis (HCC) 07/20/2017  . Essential hypertension 03/30/2017  . Elevated LDL cholesterol level 03/30/2017  . Current smoker 03/30/2017    Social History   Tobacco Use  . Smoking status: Current Every Day Smoker    Packs/day: 1.00    Years: 30.00     Pack years: 30.00  . Smokeless tobacco: Never Used  Substance Use Topics  . Alcohol use: Yes    Alcohol/week: 1.0 standard drinks    Types: 1 Glasses of wine per week    Current Outpatient Medications:  .  aspirin EC 81 MG tablet, Take 81 mg by mouth daily. Takes M-W-F, Disp: , Rfl:  .  atorvastatin (LIPITOR) 10 MG tablet, Take 1 tablet (10 mg total) by mouth daily., Disp: 90 tablet, Rfl: 3 .  cholecalciferol (VITAMIN D3) 25 MCG (1000 UT) tablet, Take 1,000 Units by mouth daily., Disp: , Rfl:  .  lisinopril-hydrochlorothiazide (PRINZIDE,ZESTORETIC) 20-12.5 MG tablet, Take 1 tablet by mouth daily., Disp: 90 tablet, Rfl: 2  Allergies  Allergen Reactions  . Sulfa Antibiotics Hives  . Other Nausea And Vomiting    Pine nuts    OBJECTIVE: BP 131/72   Pulse 69   Temp 98.2 F (36.8 C)   Ht 5\' 7"  (1.702 m)   Wt 150 lb (68 kg)   BMI 23.49 kg/m  Gen: No acute distress. Nontoxic in appearance.  HENT: AT. .  MMM.  Eyes:Pupils Equal Round Reactive to light, Extraocular movements intact,  Conjunctiva without redness, discharge or icterus.  P size round mass mid upper right eyelid, no erythema. Chest: Cough or shortness of breath not present Skin: No rashes, purpura or petechiae.  Neuro: Alert. Oriented x3   ASSESSMENT AND PLAN: Julia Erickson is a 63 y.o. female  present for  Chalazion of right upper eyelid -Discussed diagnosis with her today that this is likely a chalazion.  -Advised her to continue warm compresses 3-4 times a day for 10 to 15 minutes. -Erythromycin ointment nightly, although not likely infectious. -[employer] baby shampoo cleanses after each warm compress. -She is aware that these sometimes will self resolve with the above treatment plan and sometimes require an ophthalmologist to perform her procedure to remove the chalazion.  -We will follow-up in 2 to 4 weeks if still present and needing a referral.   > 15 minutes spent with patient, >50% of time  spent face to face    Felix PaciniRenee Shamell Suarez, DO 01/26/2019

## 2019-03-03 DIAGNOSIS — H0011 Chalazion right upper eyelid: Secondary | ICD-10-CM | POA: Diagnosis not present

## 2019-06-01 ENCOUNTER — Encounter: Payer: Self-pay | Admitting: Family Medicine

## 2019-06-01 ENCOUNTER — Ambulatory Visit: Payer: BLUE CROSS/BLUE SHIELD | Admitting: Family Medicine

## 2019-06-01 ENCOUNTER — Other Ambulatory Visit: Payer: Self-pay

## 2019-06-01 VITALS — BP 128/80 | HR 89 | Temp 97.8°F | Resp 17 | Ht 67.0 in | Wt 147.0 lb

## 2019-06-01 DIAGNOSIS — F17200 Nicotine dependence, unspecified, uncomplicated: Secondary | ICD-10-CM | POA: Diagnosis not present

## 2019-06-01 DIAGNOSIS — R7309 Other abnormal glucose: Secondary | ICD-10-CM | POA: Diagnosis not present

## 2019-06-01 DIAGNOSIS — I10 Essential (primary) hypertension: Secondary | ICD-10-CM | POA: Diagnosis not present

## 2019-06-01 DIAGNOSIS — I700 Atherosclerosis of aorta: Secondary | ICD-10-CM

## 2019-06-01 DIAGNOSIS — E7800 Pure hypercholesterolemia, unspecified: Secondary | ICD-10-CM | POA: Diagnosis not present

## 2019-06-01 DIAGNOSIS — Z23 Encounter for immunization: Secondary | ICD-10-CM

## 2019-06-01 DIAGNOSIS — R911 Solitary pulmonary nodule: Secondary | ICD-10-CM

## 2019-06-01 LAB — LIPID PANEL
Cholesterol: 174 mg/dL (ref 0–200)
HDL: 52.4 mg/dL (ref >39.00)
LDL Cholesterol: 105 mg/dL — ABNORMAL HIGH (ref 0–99)
NonHDL: 121.45
Total CHOL/HDL Ratio: 3
Triglycerides: 82 mg/dL (ref 0.0–149.0)
VLDL: 16.4 mg/dL (ref 0.0–40.0)

## 2019-06-01 LAB — TSH: TSH: 1.81 u[IU]/mL (ref 0.35–4.50)

## 2019-06-01 LAB — CBC WITH DIFFERENTIAL/PLATELET
Basophils Absolute: 0 10*3/uL (ref 0.0–0.1)
Basophils Relative: 0.5 % (ref 0.0–3.0)
Eosinophils Absolute: 0.1 10*3/uL (ref 0.0–0.7)
Eosinophils Relative: 1.1 % (ref 0.0–5.0)
HCT: 43.4 % (ref 36.0–46.0)
Hemoglobin: 14.9 g/dL (ref 12.0–15.0)
Lymphocytes Relative: 23.1 % (ref 12.0–46.0)
Lymphs Abs: 2 10*3/uL (ref 0.7–4.0)
MCHC: 34.3 g/dL (ref 30.0–36.0)
MCV: 97.8 fl (ref 78.0–100.0)
Monocytes Absolute: 0.4 10*3/uL (ref 0.1–1.0)
Monocytes Relative: 4.6 % (ref 3.0–12.0)
Neutro Abs: 6.3 10*3/uL (ref 1.4–7.7)
Neutrophils Relative %: 70.7 % (ref 43.0–77.0)
Platelets: 241 10*3/uL (ref 150.0–400.0)
RBC: 4.44 Mil/uL (ref 3.87–5.11)
RDW: 13.8 % (ref 11.5–15.5)
WBC: 8.9 10*3/uL (ref 4.0–10.5)

## 2019-06-01 LAB — COMPREHENSIVE METABOLIC PANEL
ALT: 13 U/L (ref 0–35)
AST: 17 U/L (ref 0–37)
Albumin: 4.5 g/dL (ref 3.5–5.2)
Alkaline Phosphatase: 63 U/L (ref 39–117)
BUN: 11 mg/dL (ref 6–23)
CO2: 27 mEq/L (ref 19–32)
Calcium: 9.7 mg/dL (ref 8.4–10.5)
Chloride: 104 mEq/L (ref 96–112)
Creatinine, Ser: 0.66 mg/dL (ref 0.40–1.20)
GFR: 90.37 mL/min (ref >60.00)
Glucose, Bld: 94 mg/dL (ref 70–99)
Potassium: 3.8 mEq/L (ref 3.5–5.1)
Sodium: 140 mEq/L (ref 135–145)
Total Bilirubin: 0.7 mg/dL (ref 0.2–1.2)
Total Protein: 6.5 g/dL (ref 6.0–8.3)

## 2019-06-01 LAB — HEMOGLOBIN A1C: Hgb A1c MFr Bld: 5.9 % (ref 4.6–6.5)

## 2019-06-01 MED ORDER — LISINOPRIL-HYDROCHLOROTHIAZIDE 20-12.5 MG PO TABS: 1.0000 | ORAL_TABLET | Freq: Every day | ORAL | 1 refills | Status: DC

## 2019-06-01 MED ORDER — ATORVASTATIN CALCIUM 10 MG PO TABS: 10.0000 mg | ORAL_TABLET | Freq: Every day | ORAL | 1 refills | Status: DC

## 2019-06-01 NOTE — Patient Instructions (Addendum)
It was nice to see you today.  Good luck with your move... I hope you find a great house.

## 2019-06-01 NOTE — Progress Notes (Signed)
Patient ID: Julia Erickson, female  DOB: 1956/07/04, 63 y.o.   MRN: [id number] Patient Care Team    Relationship Specialty Notifications Start End  Natalia Leatherwood, DO PCP - General Family Medicine  03/30/17   Aline August, NP Nurse Practitioner Gynecology  03/30/17     Chief Complaint  Patient presents with  . Hypertension    Pt is doing well, no complaints. Pt takes medicaitons at night     Subjective:  Julia Erickson is a 63 y.o.  Female  present for HTN follow up. She is moving back to [place].   Hypertension/HLD/Aortic atherosclerosis: Pt reports compliance with Lisinopril-Hctz and lipitor. Blood pressures ranges at home within normal limits. Patient denies chest pain, shortness of breath, dizziness or lower extremity edema.  Pt does take a daily baby ASA. Pt is  prescribed statin. BMP: 05/13/2018 within normal limits CBC: 05/13/2018 with mild increase in hemoglobin in a smoker Lipids: 05/13/2018 total cholesterol 187, LDL 118, HDL 44, triglycerides 120 TSH: 05/13/2018 within normal limits Diet: Low-sodium Exercise: Routine RF: Hypertension, hyperlipidemia, aortic atherosclerosis, smoker, family history of heart disease, family history stroke   Current every day smoker/lung cancer screen/lung nodule: Patient has smoked 1 pack a day for the past 30 years of cigarettes.She has been counseled on smoking cessation. She does meet criteria with 30-pack-year history. She is a current smoker. She does not have current symptoms. She has tried chantix in the past. Still smoking.  She elected to wait on repeat CT on her pulmonary nodules until October 2020 (last year) and now wants to wait until she establishes at new PCP.  Depression screen [facility] 2/9 06/01/2019 05/13/2018 03/22/2018 10/15/2017 05/12/2017  Decreased Interest 0 0 0 0 0  Down, Depressed, Hopeless 0 0 0 0 0  PHQ - 2 Score 0 0 0 0 0   No flowsheet data found.  Immunization History  Administered Date(s) Administered  .  Influenza,inj,Quad PF,6+ Mos 10/15/2017  . Pneumococcal Conjugate-13 05/13/2018  . Pneumococcal Polysaccharide-23 05/12/2017  . Zoster Recombinat (Shingrix) 05/13/2018, 08/17/2018    Past Medical History:  Diagnosis Date  . Allergy   . Chickenpox   . Elevated LDL cholesterol level   . Hypertension   . Smoker    Started about age 30, has  smoked int. over the years.    Allergies  Allergen Reactions  . Sulfa Antibiotics Hives  . Other Nausea And Vomiting    Pine nuts   Past Surgical History:  Procedure Laterality Date  . COLONOSCOPY  2012  . TONSILLECTOMY AND ADENOIDECTOMY  1969  . TOTAL ABDOMINAL HYSTERECTOMY W/ BILATERAL SALPINGOOPHORECTOMY  2004   Fibroid; heavy bleeding.    Family History  Problem Relation Age of Onset  . Diabetes Mother   . Heart disease Mother   . Hypercholesterolemia Mother   . Thyroid disease Mother   . Dementia Mother        mild cognitve impairment  . Diabetes Father   . Heart disease Father   . Early death Father 68  . CVA Father   . Hypertension Sister   . Hyperlipidemia Sister   . Diabetes Brother   . Hypertension Brother   . Hyperlipidemia Brother   . Stroke Maternal Grandmother   . Lung disease Maternal Grandfather        black lung  . Dementia Paternal Grandmother   . Stroke Paternal Grandfather   . Hypertension Sister    Social History   Socioeconomic  History  . Marital status: Married    Spouse name: Yvone Neu  . Number of children: 4  . Years of education: 14  . Highest education level: Not on file  Occupational History  . Occupation: retired  Scientific laboratory technician  . Financial resource strain: Not on file  . Food insecurity    Worry: Not on file    Inability: Not on file  . Transportation needs    Medical: Not on file    Non-medical: Not on file  Tobacco Use  . Smoking status: Current Every Day Smoker    Packs/day: 1.00    Years: 30.00    Pack years: 30.00  . Smokeless tobacco: Never Used  Substance and Sexual Activity   . Alcohol use: Yes    Alcohol/week: 1.0 standard drinks    Types: 1 Glasses of wine per week  . Drug use: No  . Sexual activity: Yes    Partners: Male  Lifestyle  . Physical activity    Days per week: Not on file    Minutes per session: Not on file  . Stress: Not on file  Relationships  . Social Herbalist on phone: Not on file    Gets together: Not on file    Attends religious service: Not on file    Active member of club or organization: Not on file    Attends meetings of clubs or organizations: Not on file    Relationship status: Not on file  . Intimate partner violence    Fear of current or ex partner: Not on file    Emotionally abused: Not on file    Physically abused: Not on file    Forced sexual activity: Not on file  Other Topics Concern  . Not on file  Social History Narrative   Married to [place]. They have 4 children.   BS. Retired Writer.   Drinks caffeine.   Wears her seatbelt. Wears a bicycle helmet. Smoke detector in the home.   Feels safe in her relationships.   Allergies as of 06/01/2019      Reactions   Sulfa Antibiotics Hives   Other Nausea And Vomiting   Pine nuts      Medication List       Accurate as of June 01, 2019  9:17 AM. If you have any questions, ask your nurse or doctor.        STOP taking these medications   erythromycin ophthalmic ointment Stopped by: Howard Pouch, DO     TAKE these medications   aspirin EC 81 MG tablet Take 81 mg by mouth daily. Takes M-W-F   atorvastatin 10 MG tablet Commonly known as: LIPITOR Take 1 tablet (10 mg total) by mouth daily.   cholecalciferol 25 MCG (1000 UT) tablet Commonly known as: VITAMIN D3 Take 1,000 Units by mouth daily.   Fish Oil 1000 MG Caps Take 1 capsule by mouth daily.   lisinopril-hydrochlorothiazide 20-12.5 MG tablet Commonly known as: ZESTORETIC Take 1 tablet by mouth daily.       All past medical history, surgical history, allergies, family  history, immunizations andmedications were updated in the EMR today and reviewed under the history and medication portions of their EMR.     No results found for this or any previous visit (from the past 2160 hour(s)).  ROS: 14 pt review of systems performed and negative (unless mentioned in an HPI)  Objective: BP 128/80 (BP Location: Left Arm, Patient Position: Sitting, Cuff  Size: Normal)   Pulse 89   Temp 97.8 F (36.6 C) (Temporal)   Resp 17   Ht 5\' 7"  (1.702 m)   Wt 147 lb (66.7 kg)   SpO2 99%   BMI 23.02 kg/m  Gen: Afebrile. No acute distress. Pleasant female.  HENT: AT. [place].  MMM. Eyes:Pupils Equal Round Reactive to light, Extraocular movements intact,  Conjunctiva without redness, discharge or icterus. Neck/lymp/endocrine: Supple,no lymphadenopathy, no thyromegaly CV: RRR no murmur, no edema, +2/4 P posterior tibialis pulses Chest: CTAB, no wheeze or crackles Abd: Soft. NTND. BS present. no Masses palpated.  Skin: no rashes, purpura or petechiae.  Neuro: Normal gait. PERLA. EOMi. Alert. Oriented x3  Psych: Normal affect, dress and demeanor. Normal speech. Normal thought content and judgment.   No exam data present  Assessment/plan: Julia Erickson is a 63 y.o. female present for CPE. Essential hypertension/HLD/Aortic atherosclerosis (HCC) -Stable today. Refills on Lipitor and lisinopril-HCTZ  - Continue daily baby ASA -Continue statin -Labs up-to-date - lisinopril-hydrochlorothiazide (PRINZIDE,ZESTORETIC) 20-12.5 MG tablet; Take 1 tablet by mouth daily.  Dispense: 90 tablet; Refill: 1 - atorvastatin (LIPITOR) 10 MG tablet; Take 1 tablet (10 mg total) by mouth daily.  Dispense: 90 tablet; Refill: 1  Pulmonary emphysema, unspecified emphysema type (HCC)/pulm nodule/current smoker - yearly LDCT for pul nodule is recommended, she is aware of recs but wanted to wait until 06/2019>>> now moving and wants to wait until she established with new doctor in [place].  - continue  to cut back on smoking.  Influenza vaccine adminstered today.   She is moving back to [place] - Follow with new PCP.   > 25 minutes spent with patient, >50% of time spent face to face    Electronically signed by: Felix Pacinienee , DO [facility]

## 2019-06-09 ENCOUNTER — Telehealth: Payer: Self-pay | Admitting: Family Medicine

## 2019-06-09 NOTE — Telephone Encounter (Signed)
This is listed under immunizations

## 2019-06-09 NOTE — Telephone Encounter (Signed)
Patient called to say that she does not see on mychart that she received flu shot on her last visit 09/3 with Dr. Raoul Pitch.  Just wanting to make sure it was documented on her files.  No patient call back is needed.

## 2019-10-01 DIAGNOSIS — H01005 Unspecified blepharitis left lower eyelid: Secondary | ICD-10-CM | POA: Diagnosis not present

## 2019-11-19 DIAGNOSIS — Z23 Encounter for immunization: Secondary | ICD-10-CM | POA: Diagnosis not present

## 2019-12-04 ENCOUNTER — Other Ambulatory Visit: Payer: Self-pay

## 2019-12-04 ENCOUNTER — Encounter: Payer: Self-pay | Admitting: Family Medicine

## 2019-12-04 ENCOUNTER — Ambulatory Visit (INDEPENDENT_AMBULATORY_CARE_PROVIDER_SITE_OTHER): Payer: BC Managed Care – PPO | Admitting: Family Medicine

## 2019-12-04 VITALS — BP 128/82 | HR 72 | Ht 67.0 in | Wt 143.1 lb

## 2019-12-04 DIAGNOSIS — I700 Atherosclerosis of aorta: Secondary | ICD-10-CM

## 2019-12-04 DIAGNOSIS — E7800 Pure hypercholesterolemia, unspecified: Secondary | ICD-10-CM | POA: Diagnosis not present

## 2019-12-04 DIAGNOSIS — I10 Essential (primary) hypertension: Secondary | ICD-10-CM | POA: Diagnosis not present

## 2019-12-04 DIAGNOSIS — F17200 Nicotine dependence, unspecified, uncomplicated: Secondary | ICD-10-CM | POA: Diagnosis not present

## 2019-12-04 MED ORDER — LISINOPRIL-HYDROCHLOROTHIAZIDE 20-12.5 MG PO TABS: 1.0000 | ORAL_TABLET | Freq: Every day | ORAL | 1 refills | Status: DC

## 2019-12-04 MED ORDER — ATORVASTATIN CALCIUM 10 MG PO TABS: 10.0000 mg | ORAL_TABLET | Freq: Every day | ORAL | 1 refills | Status: DC

## 2019-12-04 NOTE — Patient Instructions (Signed)
COVID-19 Vaccine Information can be found at: [email or web address] For questions related to vaccine distribution or appointments, please email [email or web address] or call [phone].  Covid Vaccine appointment go to [email or web address].  

## 2019-12-04 NOTE — Progress Notes (Signed)
VIRTUAL VISIT VIA VIDEO  I connected with Julia Erickson on 12/04/19 at 11:00 AM EST by a video enabled telemedicine application and verified that I am speaking with the correct person using two identifiers. Location patient: Home Location provider: [facility], Office Persons participating in the virtual visit: Patient, Dr. Claiborne Billings and R.Baker, LPN  I discussed the limitations of evaluation and management by telemedicine and the availability of in person appointments. The patient expressed understanding and agreed to proceed.      Patient ID: Julia Erickson, female  DOB: 1955-11-03, 64 y.o.   MRN: [id number] Patient Care Team    Relationship Specialty Notifications Start End  Natalia Leatherwood, DO PCP - General Family Medicine  03/30/17   Aline August, NP Nurse Practitioner Gynecology  03/30/17     Chief Complaint  Patient presents with  . Hypertension    Pt needs refills on medications. Pt took BP meds last night   . Hyperlipidemia    Subjective:  Julia Erickson is a 64 y.o.  Female  present for HTN follow up. She is moving back to [place].   Hypertension/HLD/Aortic atherosclerosis: Pt reports compliance with Lisinopril-Hctz and lipitor. Blood pressures ranges at home within normal limits. Patient denies chest pain, shortness of breath, dizziness or lower extremity edema.  Pt does take a daily baby ASA. Pt is  prescribed statin. BMP: 05/2019 within normal limits CBC: 05/2019 with mild increase in hemoglobin in a smoker Lipids: 05/2019 TSH: 05/2019 Diet: Low-sodium Exercise: Routine RF: Hypertension, hyperlipidemia, aortic atherosclerosis, smoker, family history of heart disease, family history stroke   Current every day smoker/lung cancer screen/lung nodule: Patient has smoked 1 pack a day for the past 30 years of cigarettes.She has been counseled on smoking cessation. She does meet criteria with 30-pack-year history. She is a current smoker. She does not have  current symptoms. She has tried chantix in the past. Still smoking.  She elected to wait on repeat CT on her pulmonary nodules until October 2020 (last year) and now wants to wait until she establishes at new PCP.  Depression screen [facility] 2/9 06/01/2019 05/13/2018 03/22/2018 10/15/2017 05/12/2017  Decreased Interest 0 0 0 0 0  Down, Depressed, Hopeless 0 0 0 0 0  PHQ - 2 Score 0 0 0 0 0   No flowsheet data found.  Immunization History  Administered Date(s) Administered  . Influenza,inj,Quad PF,6+ Mos 10/15/2017, 06/01/2019  . PFIZER SARS-COV-2 Vaccination 11/19/2019  . Pneumococcal Conjugate-13 05/13/2018  . Pneumococcal Polysaccharide-23 05/12/2017  . Zoster Recombinat (Shingrix) 05/13/2018, 08/17/2018    Past Medical History:  Diagnosis Date  . Allergy   . Chickenpox   . Elevated LDL cholesterol level   . Hypertension   . Smoker    Started about age 26, has  smoked int. over the years.    Allergies  Allergen Reactions  . Sulfa Antibiotics Hives  . Other Nausea And Vomiting    Pine nuts   Past Surgical History:  Procedure Laterality Date  . COLONOSCOPY  2012  . TONSILLECTOMY AND ADENOIDECTOMY  1969  . TOTAL ABDOMINAL HYSTERECTOMY W/ BILATERAL SALPINGOOPHORECTOMY  2004   Fibroid; heavy bleeding.    Family History  Problem Relation Age of Onset  . Diabetes Mother   . Heart disease Mother   . Hypercholesterolemia Mother   . Thyroid disease Mother   . Dementia Mother        mild cognitve impairment  . Diabetes Father   .  Heart disease Father   . Early death Father 32  . CVA Father   . Hypertension Sister   . Hyperlipidemia Sister   . Diabetes Brother   . Hypertension Brother   . Hyperlipidemia Brother   . Stroke Maternal Grandmother   . Lung disease Maternal Grandfather        black lung  . Dementia Paternal Grandmother   . Stroke Paternal Grandfather   . Hypertension Sister    Social History   Socioeconomic History  . Marital status: Married    Spouse name:  Rocky Link  . Number of children: 4  . Years of education: 13  . Highest education level: Not on file  Occupational History  . Occupation: retired  Tobacco Use  . Smoking status: Current Every Day Smoker    Packs/day: 1.00    Years: 30.00    Pack years: 30.00  . Smokeless tobacco: Never Used  Substance and Sexual Activity  . Alcohol use: Yes    Alcohol/week: 1.0 standard drinks    Types: 1 Glasses of wine per week  . Drug use: No  . Sexual activity: Yes    Partners: Male  Other Topics Concern  . Not on file  Social History Narrative   Married to [place]. They have 4 children.   BS. Retired Armed forces logistics/support/administrative officer.   Drinks caffeine.   Wears her seatbelt. Wears a bicycle helmet. Smoke detector in the home.   Feels safe in her relationships.   Social Determinants of Health   Financial Resource Strain:   . Difficulty of Paying Living Expenses: Not on file  Food Insecurity:   . Worried About Programme researcher, broadcasting/film/video in the Last Year: Not on file  . Ran Out of Food in the Last Year: Not on file  Transportation Needs:   . Lack of Transportation (Medical): Not on file  . Lack of Transportation (Non-Medical): Not on file  Physical Activity:   . Days of Exercise per Week: Not on file  . Minutes of Exercise per Session: Not on file  Stress:   . Feeling of Stress : Not on file  Social Connections:   . Frequency of Communication with Friends and Family: Not on file  . Frequency of Social Gatherings with Friends and Family: Not on file  . Attends Religious Services: Not on file  . Active Member of Clubs or Organizations: Not on file  . Attends Banker Meetings: Not on file  . Marital Status: Not on file  Intimate Partner Violence:   . Fear of Current or Ex-Partner: Not on file  . Emotionally Abused: Not on file  . Physically Abused: Not on file  . Sexually Abused: Not on file   Allergies as of 12/04/2019      Reactions   Sulfa Antibiotics Hives   Other Nausea And Vomiting   Pine nuts       Medication List       Accurate as of December 04, 2019 11:08 AM. If you have any questions, ask your nurse or doctor.        aspirin EC 81 MG tablet Take 81 mg by mouth daily. Takes M-W-F   atorvastatin 10 MG tablet Commonly known as: LIPITOR Take 1 tablet (10 mg total) by mouth daily.   cholecalciferol 25 MCG (1000 UNIT) tablet Commonly known as: VITAMIN D3 Take 1,000 Units by mouth daily.   Fish Oil 1000 MG Caps Take 1 capsule by mouth daily.   lisinopril-hydrochlorothiazide 20-12.5  MG tablet Commonly known as: ZESTORETIC Take 1 tablet by mouth daily.       All past medical history, surgical history, allergies, family history, immunizations andmedications were updated in the EMR today and reviewed under the history and medication portions of their EMR.     No results found for this or any previous visit (from the past 2160 hour(s)).  ROS: 14 pt review of systems performed and negative (unless mentioned in an HPI)  Objective: BP 128/82   Pulse 72   Ht 5\' 7"  (1.702 m)   Wt 143 lb 2 oz (64.9 kg)   BMI 22.42 kg/m  Gen: Afebrile. No acute distress.  HENT: AT. [place].  Eyes:Pupils Equal Round Reactive to light, Extraocular movements intact,  Conjunctiva without redness, discharge or icterus. Chest: no cough or shortness of breath Neuro: . PERLA. EOMi. Alert. Oriented x3  Psych: Normal affect, dress and demeanor. Normal speech. Normal thought content and judgment.  No exam data present  Assessment/plan: Julia Erickson is a 64 y.o. female present for CPE. Essential hypertension/HLD/Aortic atherosclerosis ([place]) - Stable.  - continue Lipitor - continue lisinopril-HCTZ  - Continue daily baby ASA -Labs up-to-date - establishing with new pcp in 4 mos. 2/2 to move.    Pulmonary emphysema, unspecified emphysema type (HCC)/pulm nodule/current smoker - reminded her to discuss with new pcp - yearly LDCT for pul nodule is recommended, she is aware of recs but wanted to  wait until 06/2019>>> now moving and wants to wait until she established with new doctor in [place].  - continue to cut back on smoking.     Electronically signed by: Howard Pouch, DO [place]

## 2019-12-08 DIAGNOSIS — Z23 Encounter for immunization: Secondary | ICD-10-CM | POA: Diagnosis not present

## 2020-01-10 DIAGNOSIS — H43811 Vitreous degeneration, right eye: Secondary | ICD-10-CM | POA: Diagnosis not present

## 2020-05-20 IMAGING — MG DIGITAL SCREENING BILATERAL MAMMOGRAM WITH TOMO AND CAD
8 series · 9 of 24 positions shown · non-contrast
Comparison: Previous exam(s).

CLINICAL DATA: Screening.

EXAM:
DIGITAL SCREENING BILATERAL MAMMOGRAM WITH TOMO AND CAD

[R CC synth-2D]
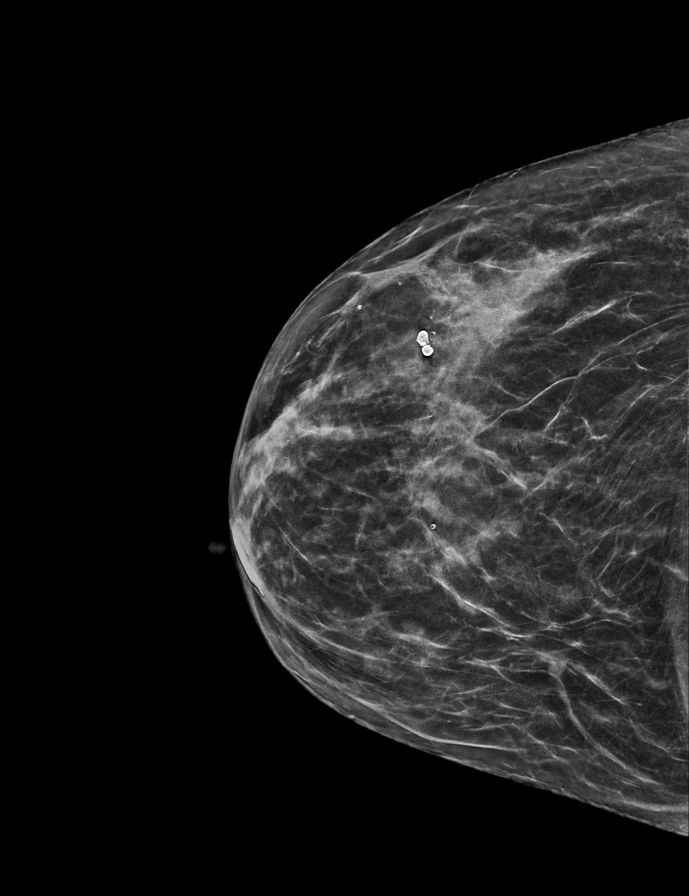

[L CC synth-2D]
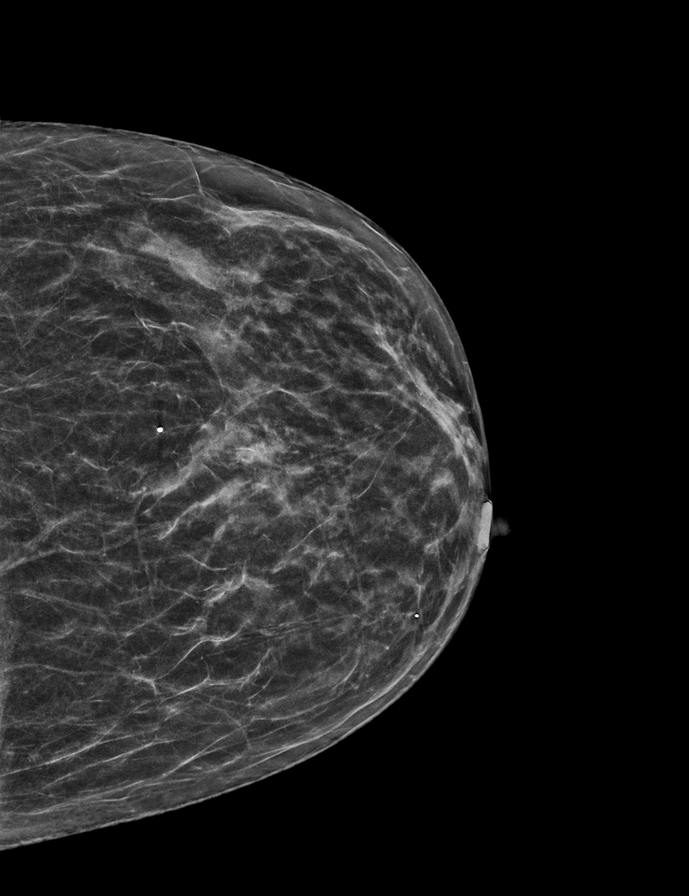

[L MLO synth-2D]
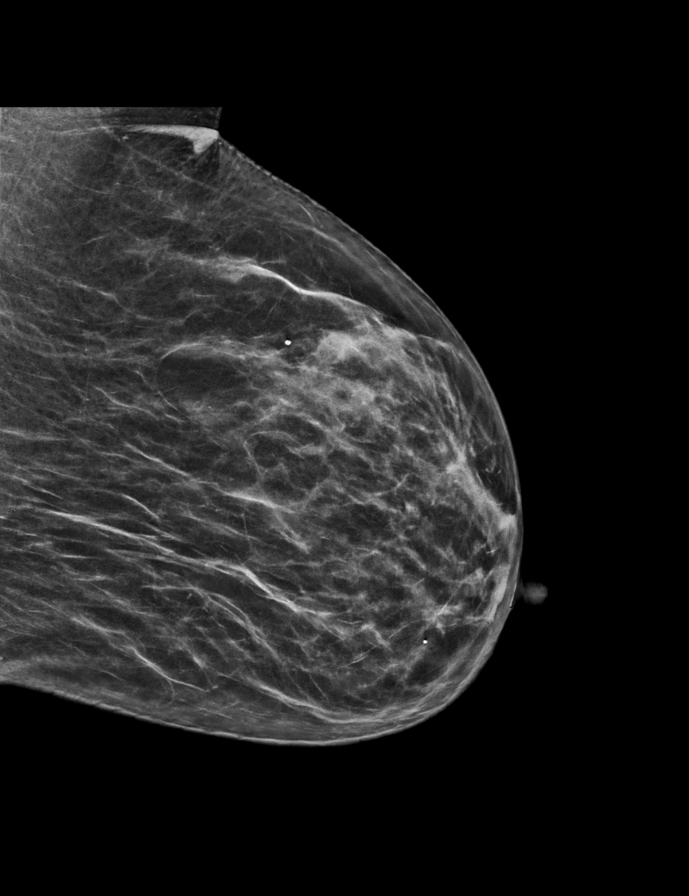

[R MLO synth-2D]
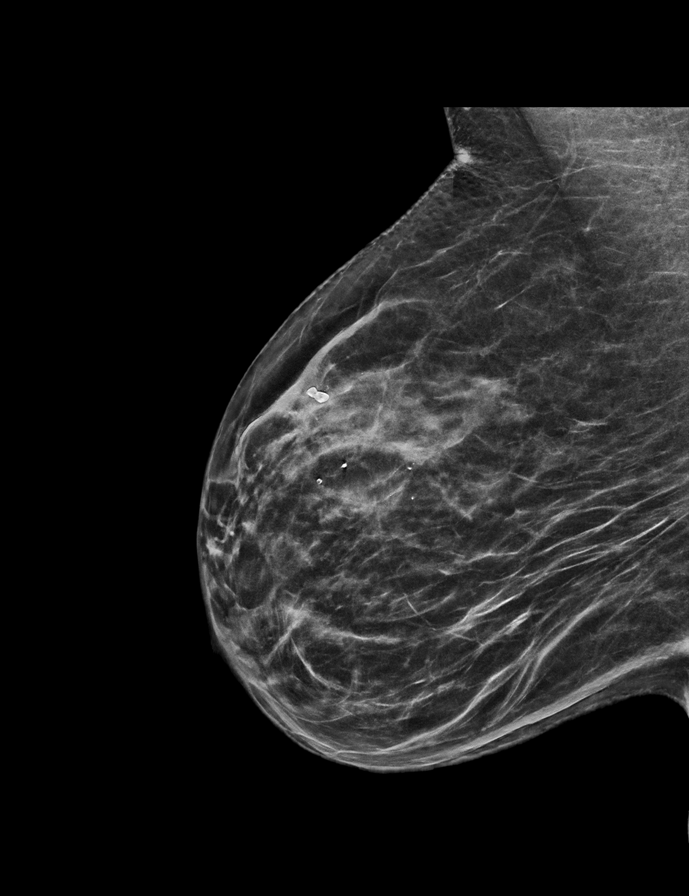

[R MLO tomo · 2 of 55 frames shown]
[frame 18/55]
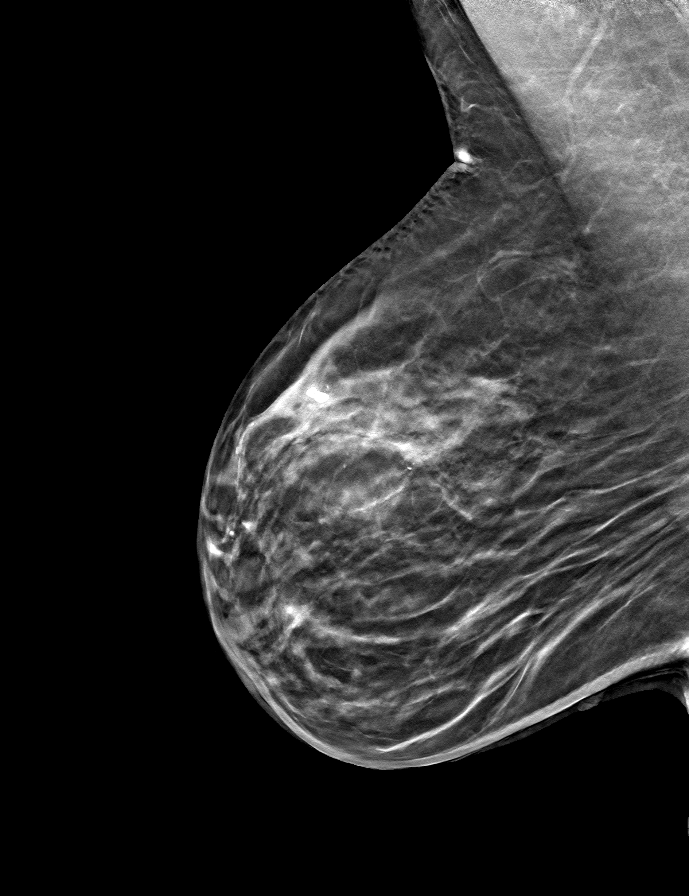
[frame 28/55]
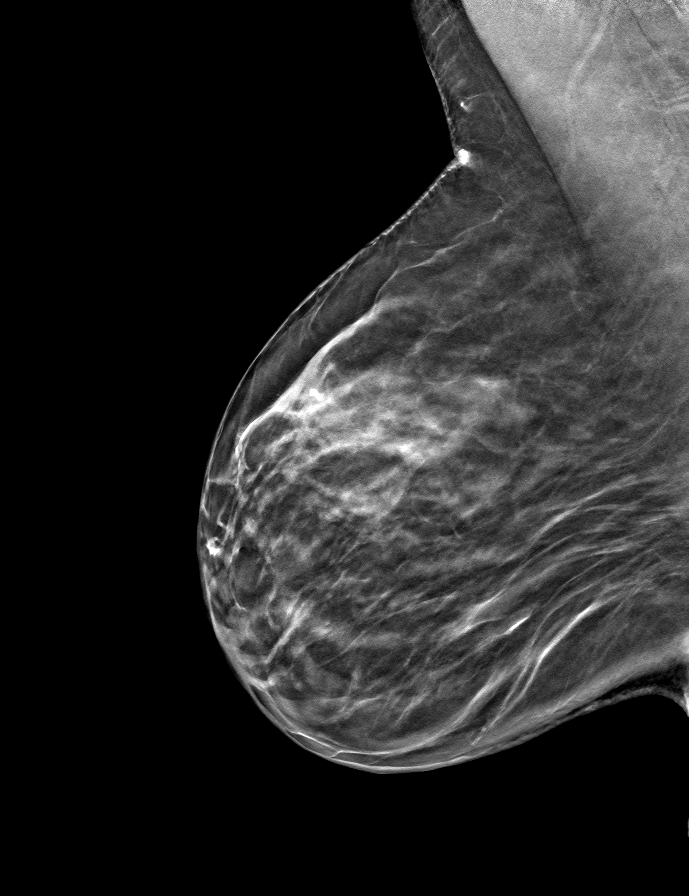

[R CC tomo · tomo slice 25/50.0]
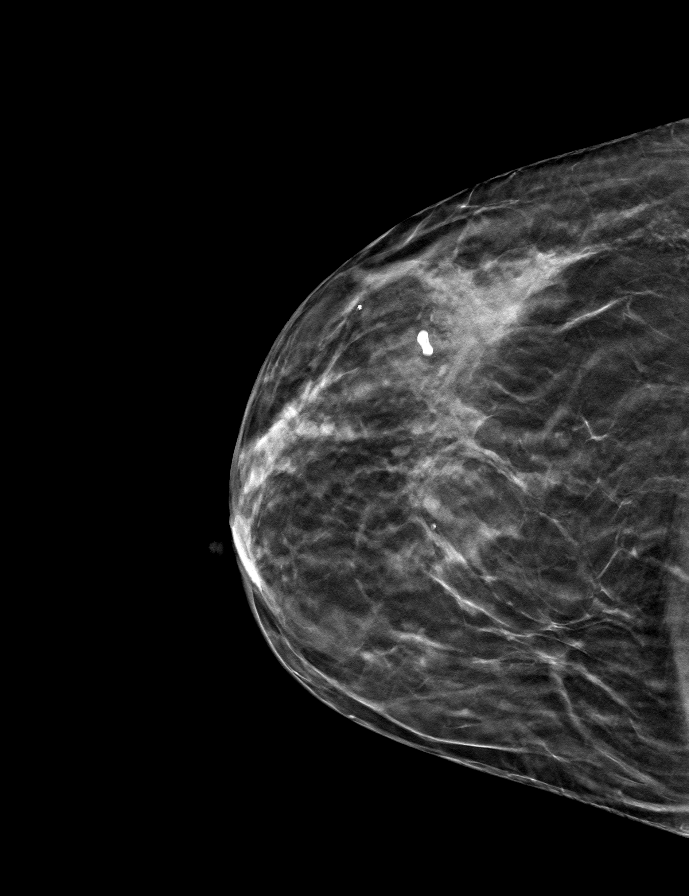

[L MLO tomo · tomo slice 27/54.0]
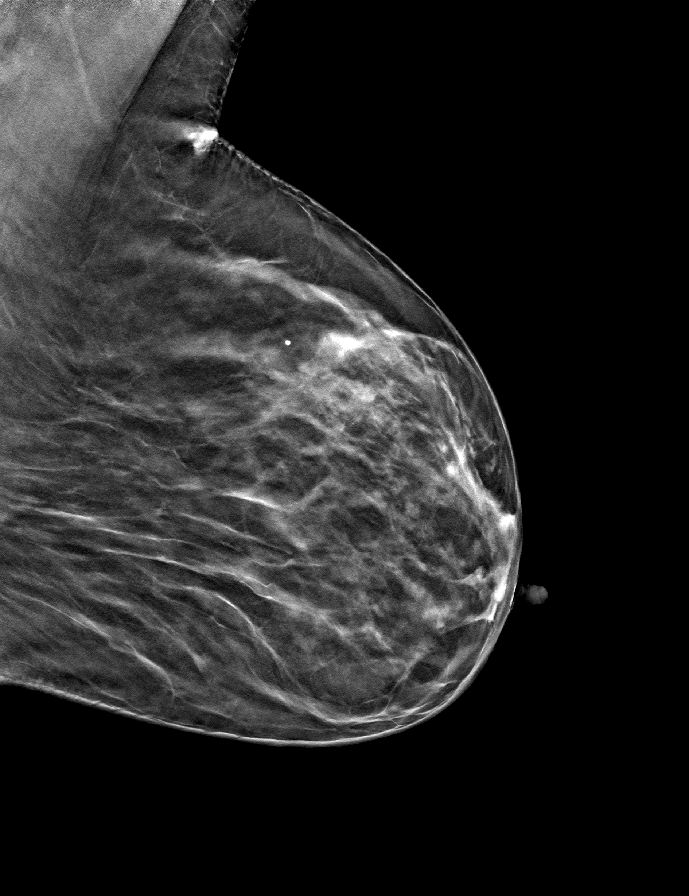

[L CC tomo · tomo slice 23/46.0]
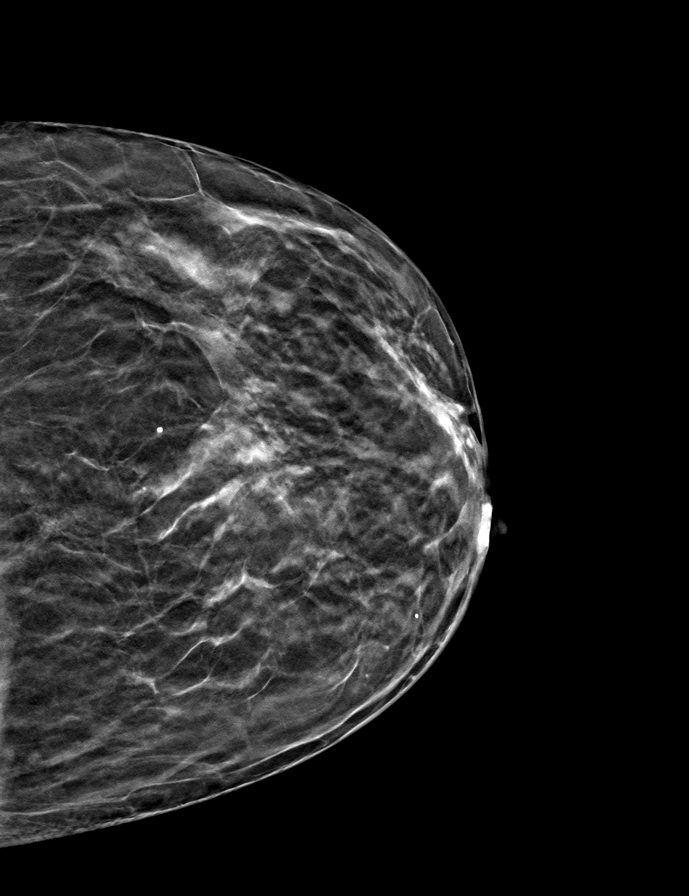

[9 of 24 positions shown; findings below may reference images not displayed]

ACR Breast Density Category c: The breast tissue is heterogeneously
dense, which may obscure small masses.
FINDINGS: There are no findings suspicious for malignancy. Images were
processed with CAD.
IMPRESSION: No mammographic evidence of malignancy. A result letter of this
screening mammogram will be mailed directly to the patient.

RECOMMENDATION:
Screening mammogram in one year. (Code:FT-U-LHB)

BI-RADS CATEGORY  1: Negative.

## 2020-05-28 ENCOUNTER — Telehealth: Payer: Self-pay

## 2020-05-28 DIAGNOSIS — E7800 Pure hypercholesterolemia, unspecified: Secondary | ICD-10-CM

## 2020-05-28 DIAGNOSIS — I10 Essential (primary) hypertension: Secondary | ICD-10-CM

## 2020-05-28 MED ORDER — LISINOPRIL-HYDROCHLOROTHIAZIDE 20-12.5 MG PO TABS: 1.0000 | ORAL_TABLET | Freq: Every day | ORAL | 0 refills | Status: AC

## 2020-05-28 MED ORDER — ATORVASTATIN CALCIUM 10 MG PO TABS: 10.0000 mg | ORAL_TABLET | Freq: Every day | ORAL | 0 refills | Status: AC

## 2020-05-28 NOTE — Telephone Encounter (Signed)
Printed a 90 day supply of Lipitor and lisinopril-hctz for her. Please send/fax to her desired pharmacy.  Reportedly [facility] in [place] (uncertain [facility]-we will need to verify with patient)

## 2020-05-28 NOTE — Telephone Encounter (Signed)
Spoke with patient and advised 90 day supply would be given but would be faxing written prescription for both medications. Verified pharmacy address with her. She was very grateful to [facility] and gave many thanks. Rx faxed and confirmation received.

## 2020-05-28 NOTE — Telephone Encounter (Signed)
Patient requesting refill of atorvastatin (LIPITOR) 10 MG tablet lisinopril-hydrochlorothiazide (ZESTORETIC) 20-12.5 MG  Sent to [facility] in [place]   Patient is waiting for new patient appointment which has been rescheduled to the end of October.

## 2020-05-28 NOTE — Telephone Encounter (Signed)
Pt is relocating and will run out of medication before her New pt OV.  RF request for atorvastatin (LIPITOR) 10 MG tablet, lisinopril-hydrochlorothiazide (ZESTORETIC) 20-12.5 MG  LOV: 12/04/2019 Next ov: n/a Last written: 12/04/2019

## 2020-07-15 DIAGNOSIS — E7800 Pure hypercholesterolemia, unspecified: Secondary | ICD-10-CM | POA: Diagnosis not present

## 2020-07-15 DIAGNOSIS — Z23 Encounter for immunization: Secondary | ICD-10-CM | POA: Diagnosis not present

## 2020-07-15 DIAGNOSIS — I10 Essential (primary) hypertension: Secondary | ICD-10-CM | POA: Diagnosis not present

## 2020-07-15 DIAGNOSIS — R011 Cardiac murmur, unspecified: Secondary | ICD-10-CM | POA: Diagnosis not present

## 2020-07-15 DIAGNOSIS — Z720 Tobacco use: Secondary | ICD-10-CM | POA: Diagnosis not present

## 2020-07-17 DIAGNOSIS — I10 Essential (primary) hypertension: Secondary | ICD-10-CM | POA: Diagnosis not present

## 2020-07-17 DIAGNOSIS — E7800 Pure hypercholesterolemia, unspecified: Secondary | ICD-10-CM | POA: Diagnosis not present

## 2020-10-15 DIAGNOSIS — M7521 Bicipital tendinitis, right shoulder: Secondary | ICD-10-CM | POA: Diagnosis not present

## 2020-10-15 DIAGNOSIS — I10 Essential (primary) hypertension: Secondary | ICD-10-CM | POA: Diagnosis not present
